# Patient Record
Sex: Female | Born: 1965 | Race: White | Hispanic: No | Marital: Married | Smoking: Former smoker
Health system: Southern US, Community
[De-identification: ages and names within clinical notes are randomized; demographics above are authoritative.]

## PROBLEM LIST (undated history)

## (undated) DIAGNOSIS — F17200 Nicotine dependence, unspecified, uncomplicated: Secondary | ICD-10-CM

## (undated) DIAGNOSIS — N809 Endometriosis, unspecified: Secondary | ICD-10-CM

## (undated) HISTORY — PX: ABDOMINAL HYSTERECTOMY: SHX81

## (undated) HISTORY — PX: TUBAL LIGATION: SHX77

## (undated) HISTORY — DX: Endometriosis, unspecified: N80.9

## (undated) HISTORY — DX: Nicotine dependence, unspecified, uncomplicated: F17.200

## (undated) HISTORY — PX: TONSILLECTOMY AND ADENOIDECTOMY: SUR1326

---

## 2001-02-17 ENCOUNTER — Emergency Department (HOSPITAL_COMMUNITY): Admission: EM | Admit: 2001-02-17 | Discharge: 2001-02-17 | Payer: Self-pay | Admitting: Emergency Medicine

## 2001-02-17 ENCOUNTER — Encounter: Payer: Self-pay | Admitting: Emergency Medicine

## 2012-04-24 ENCOUNTER — Emergency Department
Admission: EM | Admit: 2012-04-24 | Discharge: 2012-04-24 | Disposition: A | Discharge status: Home / Self Care | Payer: Self-pay | Source: Home / Self Care | Attending: Emergency Medicine | Admitting: Emergency Medicine

## 2012-04-24 DIAGNOSIS — R059 Cough, unspecified: Secondary | ICD-10-CM

## 2012-04-24 DIAGNOSIS — R05 Cough: Secondary | ICD-10-CM

## 2012-04-24 DIAGNOSIS — J069 Acute upper respiratory infection, unspecified: Secondary | ICD-10-CM

## 2012-04-24 MED ORDER — HYDROCODONE-HOMATROPINE 5-1.5 MG/5ML PO SYRP: 5.0000 mL | ORAL_SOLUTION | Freq: Four times a day (QID) | ORAL | Status: DC | PRN

## 2012-04-24 MED ORDER — AZITHROMYCIN 250 MG PO TABS: ORAL_TABLET | ORAL | Status: DC

## 2012-04-24 NOTE — ED Provider Notes (Addendum)
History     CSN: [id number]  Arrival date & time 04/24/12  1700   First MD Initiated Contact with Patient 04/24/12 1702      Chief Complaint  Patient presents with  . Nasal Congestion    x 1 week  . Cough    x 1 week    (Consider location/radiation/quality/duration/timing/severity/associated sxs/prior treatment) HPI Sabrina Hayes is a 46 y.o. female who complains of onset of cold symptoms for 8 days.  The symptoms are constant and mild-moderate in severity. + sore throat + cough No pleuritic pain No wheezing + nasal congestion + post-nasal drainage + sinus pain/pressure + chest congestion No itchy/red eyes No earache No hemoptysis No SOB + chills/sweats No fever No nausea No vomiting No abdominal pain No diarrhea No skin rashes No fatigue No myalgias + headache    History reviewed. No pertinent past medical history.  Past Surgical History  Procedure Date  . Abdominal hysterectomy     Family History  Problem Relation Age of Onset  . Hypertension Mother     History  Substance Use Topics  . Smoking status: Current Every Day Smoker -- 0.5 packs/day for 34 years    Types: Cigarettes  . Smokeless tobacco: Never Used  . Alcohol Use: No    OB History    Grav Para Term Preterm Abortions TAB SAB Ect Mult Living                  Review of Systems  All other systems reviewed and are negative.    Allergies  Codeine and Penicillins  Home Medications   Current Outpatient Rx  Name Route Sig Dispense Refill  . AZITHROMYCIN 250 MG PO TABS  Use as directed 1 each 0  . HYDROCODONE-HOMATROPINE 5-1.5 MG/5ML PO SYRP Oral Take 5 mLs by mouth every 6 (six) hours as needed for cough. 120 mL 0    BP 120/76  Pulse 81  Temp 98.3 F (36.8 C) (Oral)  Resp 18  Ht 5\' 7"  (1.702 m)  Wt 194 lb (87.998 kg)  BMI 30.38 kg/m2  SpO2 97%  Physical Exam  Nursing note and vitals reviewed. Constitutional: She is oriented to person, place, and time. She appears  well-developed and well-nourished.  HENT:  Head: Normocephalic and atraumatic.  Right Ear: Tympanic membrane, external ear and ear canal normal.  Left Ear: Tympanic membrane, external ear and ear canal normal.  Nose: Mucosal edema and rhinorrhea present.  Mouth/Throat: Posterior oropharyngeal erythema present. No oropharyngeal exudate or posterior oropharyngeal edema.  Eyes: No scleral icterus.  Neck: Neck supple.  Cardiovascular: Regular rhythm and normal heart sounds.   Pulmonary/Chest: Effort normal and breath sounds normal. No respiratory distress.  Neurological: She is alert and oriented to person, place, and time.  Skin: Skin is warm and dry.  Psychiatric: She has a normal mood and affect. Her speech is normal.    ED Course  Procedures (including critical care time)  Labs Reviewed - No data to display No results found.   1. Acute upper respiratory infections of unspecified site   2. Cough       MDM  1)  Take the prescribed antibiotic as instructed (Zpak).  She is allergic to codeine, but has taken other narcotic pain meds previously, so gave Rx for Hydromet.  If allergic reaction, stop and call clinic. 2)  Use nasal saline solution (over the counter) at least 3 times a day. 3)  Use over the counter decongestants like Zyrtec-D every 12  hours as needed to help with congestion.  If you have hypertension, do not take medicines with sudafed.  4)  Can take tylenol every 6 hours or motrin every 8 hours for pain or fever. 5)  Follow up with your primary doctor if no improvement in 5-7 days, sooner if increasing pain, fever, or new symptoms.     Marlaine Hind, MD 04/24/12 1744  Marlaine Hind, MD 04/24/12 1745

## 2012-04-24 NOTE — ED Notes (Signed)
Sabrina Hayes complains of dry cough and nasal congestion for 1 week. She states she has had fever, chills, body aches and sweats. The nasal discharge is clear.

## 2012-08-21 ENCOUNTER — Ambulatory Visit (INDEPENDENT_AMBULATORY_CARE_PROVIDER_SITE_OTHER)
Admission: RE | Admit: 2012-08-21 | Discharge: 2012-08-21 | Disposition: A | Discharge status: Home / Self Care | Payer: BC Managed Care – PPO | Source: Ambulatory Visit | Attending: Family Medicine | Admitting: Family Medicine

## 2012-08-21 ENCOUNTER — Encounter: Payer: Self-pay | Admitting: Family Medicine

## 2012-08-21 ENCOUNTER — Ambulatory Visit (INDEPENDENT_AMBULATORY_CARE_PROVIDER_SITE_OTHER): Payer: BC Managed Care – PPO | Admitting: Family Medicine

## 2012-08-21 VITALS — BP 108/70 | HR 78 | Temp 97.7°F | Ht 67.0 in | Wt 187.0 lb

## 2012-08-21 DIAGNOSIS — S63619A Unspecified sprain of unspecified finger, initial encounter: Secondary | ICD-10-CM

## 2012-08-21 DIAGNOSIS — S6390XA Sprain of unspecified part of unspecified wrist and hand, initial encounter: Secondary | ICD-10-CM

## 2012-08-21 DIAGNOSIS — F17200 Nicotine dependence, unspecified, uncomplicated: Secondary | ICD-10-CM

## 2012-08-21 MED ORDER — VARENICLINE TARTRATE 1 MG PO TABS: 1.0000 mg | ORAL_TABLET | Freq: Two times a day (BID) | ORAL | Status: DC

## 2012-08-21 MED ORDER — VARENICLINE TARTRATE 0.5 MG X 11 & 1 MG X 42 PO MISC: ORAL | Status: DC

## 2012-08-21 NOTE — Progress Notes (Signed)
Office Note 08/25/2012  CC:  Chief Complaint  Patient presents with  . Establish Care    never goes to doctor, no problems but would like to discuss chantix    HPI:  Sabrina Hayes is a 47 y.o. White female who is here to establish care and discuss chantix. Patient's most recent primary MD: none Old records were not reviewed prior to or during today's visit.  Pt here b/c she is interested in smoking cessation.  Describes success when taking chantix for 1 mo in the past--stopped taking it b/c she was doing fine.  Has relapsed and wants to try chantix again.   Currently smokes 1/2 pack/day and has done so for 35 yrs or so.  Also, has c/o left hand middle finger injury--hyperextension--about 2 mo ago.  Has hurt some and swollen on and off since that time, worse after excessive use (which is routine for her job).  No redness.  She takes no meds for this. She did not get this evaluated at the time of the injury.  A friend at work keeps bugging her to get it checked out.   Past Medical History  Diagnosis Date  . Tobacco dependence   . Endometriosis   No hx of abnormal pap smears per pt report.  Says the GYN MD who saw her and did her hysterectomy was "old and probably is retired by now".    Past Surgical History  Procedure Date  . Abdominal hysterectomy     for NONMALIGNANT reasons (endometriosis).  Ovaries are still in.    Family History  Problem Relation Age of Onset  . Hypertension Mother     History   Social History  . Marital Status: Married    Spouse Name: N/A    Number of Children: N/A  . Years of Education: N/A   Occupational History  . Not on file.   Social History Main Topics  . Smoking status: Current Every Day Smoker -- 0.5 packs/day for 34 years    Types: Cigarettes  . Smokeless tobacco: Never Used  . Alcohol Use: No  . Drug Use: No  . Sexually Active:    Other Topics Concern  . Not on file   Social History Narrative   Married, 2 children.Works in  [employer].Tobacco 18 pack-yr hx, ongoing.No signif alcohol, no drug use.No exercise.   MEDS: none  Allergies  Allergen Reactions  . Codeine   . Penicillins     ROS Review of Systems  Constitutional: Negative for fever and fatigue.  HENT: Negative for congestion and sore throat.   Eyes: Negative for visual disturbance.  Respiratory: Positive for cough (mild chronic "smoker's" cough).   Cardiovascular: Negative for chest pain.  Gastrointestinal: Negative for nausea and abdominal pain.  Genitourinary: Negative for dysuria.  Musculoskeletal: Positive for joint swelling (finger, as per HPI). Negative for back pain.  Skin: Negative for rash.  Neurological: Negative for weakness and headaches.  Hematological: Negative for adenopathy.    PE; Blood pressure 108/70, pulse 78, temperature 97.7 F (36.5 C), temperature source Temporal, height 5\' 7"  (1.702 m), weight 187 lb (84.823 kg), SpO2 97.00%. Gen: Alert, well appearing.  Patient is oriented to person, place, time, and situation. ENT: Eyes: no injection, icteris, swelling, or exudate.  EOMI, PERRLA. Nose: no drainage or turbinate edema/swelling.  No injection or focal lesion.  Mouth: lips without lesion/swelling.  Oral mucosa pink and moist.   Oropharynx without erythema, exudate, or swelling.  With full opening of jaw  she has temporary dislocation of both TM joints. Neck - No masses or thyromegaly or limitation in range of motion CV: RRR, no m/r/g.   LUNGS: CTA bilat, nonlabored resps, good aeration in all lung fields. ABD: soft, NT/ND EXT: no clubbing, cyanosis, or edema.  Left hand, middle finger with mild swelling at the MCP joint and prox phalanx.  ROM fully intact but mildly painful in middle finger.  Nontender.  No instability of collateral ligaments.     Pertinent labs:  None today  ASSESSMENT AND PLAN:   New pt: no old records to obtain.  Tobacco dependence We'll try chantix again since she got good  results last time and tolerated the med fine. Rx's given for starter pack + maintenance dosing-- for a total therapeutic duration of 12 wks.  Finger sprain Check x-ray today. Recommended rest + buddy taping but pt says "I gotta keep on going--can't let something like this stop me, there's work to do".   An After Visit Summary was printed and given to the patient.  Return in about 1 month (around 09/18/2012) for CPE (morning appt so patient can be fasting for labs).

## 2012-08-25 ENCOUNTER — Encounter: Payer: Self-pay | Admitting: Family Medicine

## 2012-08-25 DIAGNOSIS — F17200 Nicotine dependence, unspecified, uncomplicated: Secondary | ICD-10-CM | POA: Insufficient documentation

## 2012-08-25 NOTE — Assessment & Plan Note (Signed)
We'll try chantix again since she got good results last time and tolerated the med fine. Rx's given for starter pack + maintenance dosing-- for a total therapeutic duration of 12 wks.

## 2012-08-25 NOTE — Assessment & Plan Note (Signed)
Check x-ray today. Recommended rest + buddy taping but pt says "I gotta keep on going--can't let something like this stop me, there's work to do".

## 2012-09-18 ENCOUNTER — Encounter: Payer: Self-pay | Admitting: Family Medicine

## 2012-09-18 ENCOUNTER — Ambulatory Visit (INDEPENDENT_AMBULATORY_CARE_PROVIDER_SITE_OTHER): Payer: BC Managed Care – PPO | Admitting: Family Medicine

## 2012-09-18 VITALS — BP 126/78 | HR 70 | Ht 67.0 in | Wt 179.0 lb

## 2012-09-18 DIAGNOSIS — Z0000 Encounter for general adult medical examination without abnormal findings: Secondary | ICD-10-CM

## 2012-09-18 LAB — CBC WITH DIFFERENTIAL/PLATELET
Basophils Absolute: 0 10*3/uL (ref 0.0–0.1)
Basophils Relative: 0.5 % (ref 0.0–3.0)
Eosinophils Absolute: 0.3 10*3/uL (ref 0.0–0.7)
Eosinophils Relative: 3.7 % (ref 0.0–5.0)
HCT: 41.3 % (ref 36.0–46.0)
Hemoglobin: 13.8 g/dL (ref 12.0–15.0)
Lymphocytes Relative: 38.8 % (ref 12.0–46.0)
Lymphs Abs: 2.9 10*3/uL (ref 0.7–4.0)
MCHC: 33.4 g/dL (ref 30.0–36.0)
MCV: 91.2 fl (ref 78.0–100.0)
Monocytes Absolute: 0.6 10*3/uL (ref 0.1–1.0)
Monocytes Relative: 8.6 % (ref 3.0–12.0)
Neutro Abs: 3.6 10*3/uL (ref 1.4–7.7)
Neutrophils Relative %: 48.4 % (ref 43.0–77.0)
Platelets: 342 10*3/uL (ref 150.0–400.0)
RBC: 4.53 Mil/uL (ref 3.87–5.11)
RDW: 13.8 % (ref 11.5–14.6)
WBC: 7.4 10*3/uL (ref 4.5–10.5)

## 2012-09-18 LAB — COMPREHENSIVE METABOLIC PANEL
ALT: 14 U/L (ref 0–35)
Albumin: 4.1 g/dL (ref 3.5–5.2)
CO2: 27 mEq/L (ref 19–32)
Calcium: 9.1 mg/dL (ref 8.4–10.5)
Chloride: 105 mEq/L (ref 96–112)
GFR: 113.73 mL/min (ref >60.00)
Glucose, Bld: 78 mg/dL (ref 70–99)
Potassium: 3.8 mEq/L (ref 3.5–5.1)
Sodium: 138 mEq/L (ref 135–145)
Total Bilirubin: 1 mg/dL (ref 0.3–1.2)
Total Protein: 7 g/dL (ref 6.0–8.3)

## 2012-09-18 LAB — TSH: TSH: 0.43 u[IU]/mL (ref 0.35–5.50)

## 2012-09-18 LAB — LIPID PANEL
Cholesterol: 103 mg/dL (ref 0–200)
VLDL: 18.6 mg/dL (ref 0.0–40.0)

## 2012-09-18 NOTE — Assessment & Plan Note (Addendum)
Reviewed age and gender appropriate health maintenance issues (prudent diet, regular exercise, health risks of tobacco and excessive alcohol, use of seatbelts, fire alarms in home, use of sunscreen).  Also reviewed age and gender appropriate health screening as well as vaccine recommendations. Fasting health panel labs drawn today. She declined mammogram today despite being informed of benefits/risks of this screening test. No further pap smears required due to hysterectomy for benign reasons and no prior hx of abnormal paps. Encouraged smoking cessation, which she'll start in one week (will start chantix tomorrow). Encouraged increased exercise.  She is starting vegan diet and I encouraged her to take vita B12 and folate supplement if she is consistent with this diet.

## 2012-09-18 NOTE — Progress Notes (Signed)
Office Note 09/18/2012  CC:  Chief Complaint  Patient presents with  . Annual Exam    fasting labs    HPI:  Sabrina Hayes is a 47 y.o. White female who is CPE with fasting labs. Hasn't had a chance to start chantix yet--plans on tomorrow. Feels good but lost her voice a couple of days ago, has slight cough, slight ST--esp at night (dry), slight nasal cong/PND.  NO fevers. She fasted today for lab work.  She declines breast exam today. She also declines mammogram today.  Past Medical History  Diagnosis Date  . Tobacco dependence   . Endometriosis     Past Surgical History  Procedure Laterality Date  . Abdominal hysterectomy      for NONMALIGNANT reasons (endometriosis).  Ovaries are still in.    Family History  Problem Relation Age of Onset  . Hypertension Mother     History   Social History  . Marital Status: Married    Spouse Name: N/A    Number of Children: N/A  . Years of Education: N/A   Occupational History  . Not on file.   Social History Main Topics  . Smoking status: Current Every Day Smoker -- 0.50 packs/day for 34 years    Types: Cigarettes  . Smokeless tobacco: Never Used  . Alcohol Use: No  . Drug Use: No  . Sexually Active:    Other Topics Concern  . Not on file   Social History Narrative   Married, 2 children.   Works in [employer].   Tobacco 18 pack-yr hx, ongoing.   No signif alcohol, no drug use.   No exercise.    Outpatient Prescriptions Prior to Visit  Medication Sig Dispense Refill  . varenicline (CHANTIX CONTINUING MONTH PAK) 1 MG tablet Take 1 tablet (1 mg total) by mouth 2 (two) times daily.  60 tablet  1  . varenicline (CHANTIX STARTING MONTH PAK) 0.5 MG X 11 & 1 MG X 42 tablet Take one 0.5 mg tablet by mouth once daily for 3 days, then increase to one 0.5 mg tablet twice daily for 4 days, then increase to one 1 mg tablet twice daily.  53 tablet  0   No facility-administered medications prior to visit.     Allergies  Allergen Reactions  . Codeine   . Penicillins     ROS Review of Systems  Constitutional: Negative for fever, chills, appetite change and fatigue.  HENT: Positive for voice change. Negative for ear pain, congestion, sore throat, neck stiffness and dental problem.        See HPI  Eyes: Negative for discharge, redness and visual disturbance.  Respiratory: Negative for cough, chest tightness, shortness of breath and wheezing.   Cardiovascular: Negative for chest pain, palpitations and leg swelling.  Gastrointestinal: Negative for nausea, vomiting, abdominal pain, diarrhea and blood in stool.  Genitourinary: Negative for dysuria, urgency, frequency, hematuria, flank pain and difficulty urinating.  Musculoskeletal: Negative for myalgias, back pain, joint swelling and arthralgias.  Skin: Negative for pallor and rash.  Neurological: Negative for dizziness, speech difficulty, weakness and headaches.  Hematological: Negative for adenopathy. Does not bruise/bleed easily.  Psychiatric/Behavioral: Negative for confusion and sleep disturbance. The patient is not nervous/anxious.     PE; Blood pressure 126/78, pulse 70, height 5\' 7"  (1.702 m), weight 179 lb (81.194 kg). Gen: Alert, well appearing.  Patient is oriented to person, place, time, and situation. AFFECT: pleasant, lucid thought and speech. ENT: Ears: EACs clear,  normal epithelium.  TMs with good light reflex and landmarks bilaterally.  Eyes: no injection, icteris, swelling, or exudate.  EOMI, PERRLA. Nose: no drainage or turbinate edema/swelling.  No injection or focal lesion.  Mouth: lips without lesion/swelling.  Oral mucosa pink and moist.  Dentition intact and without obvious caries or gingival swelling.  Oropharynx without erythema, exudate, or swelling.  Neck: supple/nontender.  No LAD, mass, or TM.  Carotid pulses 2+ bilaterally, without bruits. CV: RRR, no m/r/g.   LUNGS: CTA bilat, nonlabored resps, good aeration  in all lung fields. ABD: soft, NT, ND, BS normal.  No hepatospenomegaly or mass.  No bruits. EXT: no clubbing, cyanosis, or edema.  Musculoskeletal: no joint swelling, erythema, warmth, or tenderness.  ROM of all joints intact. Skin - no sores or suspicious lesions or rashes or color changes Neuro: CN 2-12 intact bilaterally, strength 5/5 in proximal and distal upper extremities and lower extremities bilaterally.  No sensory deficits.  No tremor.  No disdiadochokinesis.  No ataxia.  Upper extremity and lower extremity DTRs symmetric.  No pronator drift.   Pertinent labs:  none  ASSESSMENT AND PLAN:   Health maintenance examination Reviewed age and gender appropriate health maintenance issues (prudent diet, regular exercise, health risks of tobacco and excessive alcohol, use of seatbelts, fire alarms in home, use of sunscreen).  Also reviewed age and gender appropriate health screening as well as vaccine recommendations. Fasting health panel labs drawn today. She declined mammogram today despite being informed of benefits/risks of this screening test. No further pap smears required due to hysterectomy for benign reasons and no prior hx of abnormal paps. Encouraged smoking cessation, which she'll start in one week (will start chantix tomorrow). Encouraged increased exercise.  She is starting vegan diet and I encouraged her to take vita B12 and folate supplement if she is consistent with this diet.    An After Visit Summary was printed and given to the patient.  FOLLOW UP:  Return in about 1 year (around 09/18/2013) for CPE.

## 2012-09-18 NOTE — Patient Instructions (Signed)

## 2012-10-05 ENCOUNTER — Encounter: Payer: Self-pay | Admitting: *Deleted

## 2013-02-20 ENCOUNTER — Emergency Department (INDEPENDENT_AMBULATORY_CARE_PROVIDER_SITE_OTHER): Payer: BC Managed Care – PPO

## 2013-02-20 ENCOUNTER — Emergency Department
Admission: EM | Admit: 2013-02-20 | Discharge: 2013-02-20 | Disposition: A | Discharge status: Home / Self Care | Payer: BC Managed Care – PPO | Source: Home / Self Care | Attending: Family Medicine | Admitting: Family Medicine

## 2013-02-20 DIAGNOSIS — R05 Cough: Secondary | ICD-10-CM

## 2013-02-20 DIAGNOSIS — R0602 Shortness of breath: Secondary | ICD-10-CM

## 2013-02-20 DIAGNOSIS — J209 Acute bronchitis, unspecified: Secondary | ICD-10-CM

## 2013-02-20 LAB — POCT CBC W AUTO DIFF (K'VILLE URGENT CARE)

## 2013-02-20 MED ORDER — BENZONATATE 100 MG PO CAPS: ORAL_CAPSULE | ORAL | Status: DC

## 2013-02-20 MED ORDER — DOXYCYCLINE HYCLATE 100 MG PO CAPS: 100.0000 mg | ORAL_CAPSULE | Freq: Two times a day (BID) | ORAL | Status: DC

## 2013-02-20 MED ORDER — PREDNISONE 20 MG PO TABS: 20.0000 mg | ORAL_TABLET | Freq: Two times a day (BID) | ORAL | Status: DC

## 2013-02-20 NOTE — ED Provider Notes (Signed)
CSN: [id number]     Arrival date & time 02/20/13  1158 History     First MD Initiated Contact with Patient 02/20/13 1220     Chief Complaint  Patient presents with  . Shortness of Breath    x 2 weeks  . Cough    x 2 weeks      HPI Comments: Patient complains of onset of a cold-like illness about 2 weeks ago with initial cough and nasal congestion, followed by a mild sore throat about 2 days later.  The non-productive cough has gradually become worse.  She now has shortness of breath with activity and wheezing at night.  No pleuritic pain.  For the past week she has had intermittent chills/sweats.  She has had pneumonia in the distant past.  She continues to smoke.  The history is provided by the patient.    Past Medical History  Diagnosis Date  . Tobacco dependence   . Endometriosis    Past Surgical History  Procedure Laterality Date  . Abdominal hysterectomy      for NONMALIGNANT reasons (endometriosis).  Ovaries are still in.  . Tonsillectomy and adenoidectomy    . Tubal ligation     Family History  Problem Relation Age of Onset  . Hypertension Mother    History  Substance Use Topics  . Smoking status: Current Every Day Smoker -- 0.50 packs/day for 34 years    Types: Cigarettes  . Smokeless tobacco: Never Used  . Alcohol Use: No   OB History   Grav Para Term Preterm Abortions TAB SAB Ect Mult Living                 Review of Systems + sore throat + cough No pleuritic pain + wheezing + nasal congestion + post-nasal drainage No sinus pain/pressure No itchy/red eyes No earache No hemoptysis +SOB with activity No fever, + chills/sweats No nausea No vomiting No abdominal pain No diarrhea No urinary symptoms No skin rashes + fatigue No myalgias + headache Used OTC meds without relief  Allergies  Codeine and Penicillins  Home Medications   Current Outpatient Rx  Name  Route  Sig  Dispense  Refill  . benzonatate (TESSALON) 100 MG capsule     Take one cap at bedtime as necessary for cough   12 capsule   0   . doxycycline (VIBRAMYCIN) 100 MG capsule   Oral   Take 1 capsule (100 mg total) by mouth 2 (two) times daily.   20 capsule   0   . predniSONE (DELTASONE) 20 MG tablet   Oral   Take 1 tablet (20 mg total) by mouth 2 (two) times daily. Take with food.   10 tablet   0   . varenicline (CHANTIX CONTINUING MONTH PAK) 1 MG tablet   Oral   Take 1 tablet (1 mg total) by mouth 2 (two) times daily.   60 tablet   1   . varenicline (CHANTIX STARTING MONTH PAK) 0.5 MG X 11 & 1 MG X 42 tablet      Take one 0.5 mg tablet by mouth once daily for 3 days, then increase to one 0.5 mg tablet twice daily for 4 days, then increase to one 1 mg tablet twice daily.   53 tablet   0    BP 112/67  Pulse 87  Temp(Src) 98.1 F (36.7 C) (Oral)  Ht 5\' 7"  (1.702 m)  Wt 173 lb (78.472 kg)  BMI 27.09 kg/m2  SpO2 97% Physical Exam Nursing notes and Vital Signs reviewed. Appearance:  Patient appears healthy, stated age, and in no acute distress Eyes:  Pupils are equal, round, and reactive to light and accomodation.  Extraocular movement is intact.  Conjunctivae are not inflamed  Ears:  Canals normal.  Tympanic membranes normal.  Nose:  Mildly congested turbinates.  No sinus tenderness.   Pharynx:  Normal Neck:  Supple.   No adenopathy Lungs:   Bilateral wheezes and scattered rhonchi.  No rales.  Breath sounds are equal.  Heart:  Regular rate and rhythm without murmurs, rubs, or gallops.  Abdomen:  Nontender without masses or hepatosplenomegaly.  Bowel sounds are present.  No CVA or flank tenderness.  Extremities:  No edema.  No calf tenderness Skin:  No rash present.   ED Course   Procedures  none  Labs Reviewed  POCT CBC W AUTO DIFF ([facility]) - Abnormal; Notable for the following:  WBC 11.3; LY 40.5; MO 6.3; GR 53.2; Hgb 14.2; Platelets 364    Dg Chest 2 View  02/20/2013   *RADIOLOGY REPORT*  Clinical Data: Shortness  of breath, cough.  CHEST - 2 VIEW  Comparison: None.  Findings: Mild hyperinflation.  Heart is normal size.  No effusions.  No acute bony abnormality.  IMPRESSION: Mild hyperinflation.  No acute findings.   Original Report Authenticated By: Charlett Nose, M.D.   1. Cough   2. Acute bronchitis; note mild leukocytosis 11.3   3. Bronchitis, acute, with bronchospasm     MDM  Begin doxycycline and prednisone burst. Take plain Mucinex (guaifenesin) twice daily for cough and congestion.  Increase fluid intake, rest. Stop all antihistamines for now, and other non-prescription cough/cold preparations. Urged to discontinue smoking Follow-up with family doctor if not improving 7 to 10 days.   Lattie Haw, MD 02/20/13 1316

## 2013-02-20 NOTE — ED Notes (Signed)
Breyah complains of body aches, dizziness, headaches, sore throat, sneezing, congestion, wheezing, dry cough, and shortness of breath for 2 weeks. She also has fever, chills and sweats.

## 2013-05-21 ENCOUNTER — Ambulatory Visit (INDEPENDENT_AMBULATORY_CARE_PROVIDER_SITE_OTHER): Payer: BC Managed Care – PPO | Admitting: Family Medicine

## 2013-05-21 ENCOUNTER — Encounter: Payer: Self-pay | Admitting: Family Medicine

## 2013-05-21 VITALS — BP 115/72 | HR 69 | Temp 98.2°F | Resp 18 | Ht 67.0 in | Wt 181.0 lb

## 2013-05-21 DIAGNOSIS — L299 Pruritus, unspecified: Secondary | ICD-10-CM

## 2013-05-21 MED ORDER — PREDNISONE 20 MG PO TABS: ORAL_TABLET | ORAL | Status: DC

## 2013-05-21 NOTE — Assessment & Plan Note (Signed)
Unknown etiology. Prednisone 40mg  qd x 7d, then 20mg  qd x 7d, then 10mg  qd x 8d. Keep an eye out for irritants, triggers. If this goes away and then returns again I would have her see an allergist or dermatologist.

## 2013-05-21 NOTE — Progress Notes (Signed)
OFFICE NOTE  05/21/2013  CC:  Chief Complaint  Patient presents with  . itch    both arms for months     HPI: Patient is a 47 y.o. Caucasian female who is here for itch on arms. At least a 4 mo hx of bilat upper arms itching, w/out rash.  Extensor surfaces only. Wynelle Link exposure makes it worse.  She has applied many "home remedies"--tea bags, urine, chlorox, alcohol, peroxide, many OTC creams and lotions including benadryl cream and hydrocortisone ointment.  No other part of the body itches. No known contact irritants. She is a vegan, gets B12 supp through "monster" energy drinks.    Pertinent PMH:  Past Medical History  Diagnosis Date  . Tobacco dependence   . Endometriosis     MEDS:  NONE  PE: Blood pressure 115/72, pulse 69, temperature 98.2 F (36.8 C), temperature source Temporal, resp. rate 18, height 5\' 7"  (1.702 m), weight 181 lb (82.101 kg), SpO2 98.00%. Gen: Alert, well appearing.  Patient is oriented to person, place, time, and situation. CV: RRR, no m/r/g.   LUNGS: CTA bilat, nonlabored resps, good aeration in all lung fields. SKIN: extensor surfaces of both upper arms have a generalized, mild erythema without rash.  This is not distinctly demarcated but there is a slight delineation between the itchy skin and the non-itchy skin.    IMPRESSION AND PLAN:  Skin pruritus Unknown etiology. Prednisone 40mg  qd x 7d, then 20mg  qd x 7d, then 10mg  qd x 8d. Keep an eye out for irritants, triggers. If this goes away and then returns again I would have her see an allergist or dermatologist.   An After Visit Summary was printed and given to the patient.  FOLLOW UP: prn

## 2013-10-30 ENCOUNTER — Ambulatory Visit (INDEPENDENT_AMBULATORY_CARE_PROVIDER_SITE_OTHER): Payer: 59 | Admitting: Internal Medicine

## 2013-10-30 ENCOUNTER — Encounter: Payer: Self-pay | Admitting: Internal Medicine

## 2013-10-30 VITALS — BP 126/80 | HR 70 | Temp 97.0°F | Resp 20 | Ht 67.0 in | Wt 195.0 lb

## 2013-10-30 DIAGNOSIS — J209 Acute bronchitis, unspecified: Secondary | ICD-10-CM

## 2013-10-30 MED ORDER — DOXYCYCLINE MONOHYDRATE 100 MG PO TABS: 100.0000 mg | ORAL_TABLET | Freq: Two times a day (BID) | ORAL | Status: DC

## 2013-10-30 MED ORDER — ALBUTEROL SULFATE HFA 108 (90 BASE) MCG/ACT IN AERS: 2.0000 | INHALATION_SPRAY | Freq: Four times a day (QID) | RESPIRATORY_TRACT | Status: DC | PRN

## 2013-10-30 NOTE — Progress Notes (Signed)
Pre-visit discussion using our clinic review tool. No additional management support is needed unless otherwise documented below in the visit note.  

## 2013-10-30 NOTE — Progress Notes (Signed)
   Subjective:    Patient ID: Sabrina KochAmanda Hayes, female    DOB: 01/08/1966, 48 y.o.   MRN: [id number]  HPI Thinks she has bronchitis again Sick since about 9 days ago Dry cough Shortness of breath Body aches and feels tired  Some headache No sig nasal congestion or ear pain No sore throat Felt hot--not sure if fever No sweats  Used home cough remedy---helped her sleep  Not ready to stop smoking  No current outpatient prescriptions on file prior to visit.   No current facility-administered medications on file prior to visit.    Allergies  Allergen Reactions  . Codeine   . Penicillins     Past Medical History  Diagnosis Date  . Tobacco dependence   . Endometriosis     Past Surgical History  Procedure Laterality Date  . Abdominal hysterectomy      for NONMALIGNANT reasons (endometriosis).  Ovaries are still in.  . Tonsillectomy and adenoidectomy    . Tubal ligation      Family History  Problem Relation Age of Onset  . Hypertension Mother     History   Social History  . Marital Status: Married    Spouse Name: N/A    Number of Children: N/A  . Years of Education: N/A   Occupational History  . Not on file.   Social History Main Topics  . Smoking status: Current Every Day Smoker -- 0.50 packs/day for 34 years    Types: Cigarettes  . Smokeless tobacco: Never Used  . Alcohol Use: No  . Drug Use: No  . Sexual Activity: Not on file   Other Topics Concern  . Not on file   Social History Narrative   Married, 2 children.   Works in [employer].   Tobacco 18 pack-yr hx, ongoing.   No signif alcohol, no drug use.   No exercise.   Review of Systems No vomiting or diarrhea Mild AM nausea Appetite is okay    Objective:   Physical Exam  Constitutional: She appears well-developed and well-nourished. No distress.  Coarse cough  HENT:  Mouth/Throat: Oropharynx is clear and moist. No oropharyngeal exudate.  No sinus tenderness TMs  normal Moderate nasal congestion, some yellow discharge  Neck: Normal range of motion. Neck supple. No thyromegaly present.  Pulmonary/Chest: Effort normal. No respiratory distress. She has no wheezes. She has no rales.  Not tight Fair air movement Slight rhonchi  Lymphadenopathy:    She has no cervical adenopathy.          Assessment & Plan:

## 2013-10-30 NOTE — Assessment & Plan Note (Signed)
Almost 10 days Will treat with doxy Didn't like prednisone and not tight---will give albuterol for prn use Discussed the importance of cigarette cessation

## 2014-02-18 ENCOUNTER — Encounter: Payer: Self-pay | Admitting: Family Medicine

## 2014-02-18 ENCOUNTER — Ambulatory Visit (INDEPENDENT_AMBULATORY_CARE_PROVIDER_SITE_OTHER): Payer: 59 | Admitting: Family Medicine

## 2014-02-18 VITALS — BP 131/82 | HR 65 | Temp 98.2°F | Ht 67.0 in | Wt 200.0 lb

## 2014-02-18 DIAGNOSIS — L299 Pruritus, unspecified: Secondary | ICD-10-CM

## 2014-02-18 MED ORDER — FEXOFENADINE HCL 180 MG PO TABS: 180.0000 mg | ORAL_TABLET | Freq: Every day | ORAL | Status: DC

## 2014-02-18 NOTE — Progress Notes (Signed)
Pre visit review using our clinic review tool, if applicable. No additional management support is needed unless otherwise documented below in the visit note. 

## 2014-02-18 NOTE — Progress Notes (Signed)
OFFICE NOTE  02/18/2014  CC:  Chief Complaint  Patient presents with  . Rash   HPI: Patient is a 48 y.o. Caucasian female who is here for chronic itching in upper arms. Systemic steroids no help in the past. Saw derm, rx'd topical steroid for possible eczema but no help. She doesn't take antihistamine.  She applies eucerin cream daily. She has not been applying the topical steroid rx'd by derm.  Pertinent PMH:  Past medical, surgical, social, and family history reviewed and no changes are noted since last office visit.  MEDS:  none  PE: Blood pressure 131/82, pulse 65, temperature 98.2 F (36.8 C), temperature source Temporal, height 5\' 7"  (1.702 m), weight 200 lb (90.719 kg), SpO2 98.00%. Gen: Alert, well appearing.  Patient is oriented to person, place, time, and situation. Skin: upper arms with no rash, discoloration, lesion, or excoriations.  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 I recommended she not apply any topical steroids. Continue daily eucerin cream, add aquafor ointment daily. Start allegra 180mg  qd.  An After Visit Summary was printed and given to the patient.  FOLLOW UP: prn

## 2014-02-18 NOTE — Patient Instructions (Signed)
Continue eucerin cream.   Add daily aquafor ointment ON TOP of the cream.

## 2014-10-14 ENCOUNTER — Encounter: Payer: Self-pay | Admitting: *Deleted

## 2014-10-14 ENCOUNTER — Emergency Department (INDEPENDENT_AMBULATORY_CARE_PROVIDER_SITE_OTHER): Payer: BLUE CROSS/BLUE SHIELD

## 2014-10-14 ENCOUNTER — Emergency Department
Admission: EM | Admit: 2014-10-14 | Discharge: 2014-10-14 | Disposition: A | Discharge status: Home / Self Care | Payer: BLUE CROSS/BLUE SHIELD | Source: Home / Self Care | Attending: Emergency Medicine | Admitting: Emergency Medicine

## 2014-10-14 DIAGNOSIS — S46911A Strain of unspecified muscle, fascia and tendon at shoulder and upper arm level, right arm, initial encounter: Secondary | ICD-10-CM

## 2014-10-14 DIAGNOSIS — M25511 Pain in right shoulder: Secondary | ICD-10-CM | POA: Diagnosis not present

## 2014-10-14 NOTE — ED Notes (Signed)
Sabrina Hayes c/o 2 days of right shoulder pain with limited ROM. She was stuffing a pillow @ work when the pain started. Denies filing workers comp.

## 2014-10-14 NOTE — ED Provider Notes (Signed)
CSN: [id number]     Arrival date & time 10/14/14  1405 History   None    Chief Complaint  Patient presents with  . Shoulder Pain   Here with husband HPI Sabrina Folksmanda c/o 2 days of right shoulder pain with limited ROM.  She was stuffing a pillow @ work when the pain started. Denies filing workers comp.  The right shoulder pain is mostly anterior , dull right shoulder, pain 3 out of 10 intensity without radiation. Pains worsens with movement. No paresthesias or focal weakness. No rash or fever or chills or nausea or vomiting or abdominal pain No chest pain or shortness of breath or cardiorespiratory symptoms. She has not tried any OTC medication for the pain. Past Medical History  Diagnosis Date  . Tobacco dependence   . Endometriosis    Past Surgical History  Procedure Laterality Date  . Abdominal hysterectomy      for NONMALIGNANT reasons (endometriosis).  Ovaries are still in.  . Tonsillectomy and adenoidectomy    . Tubal ligation     Family History  Problem Relation Age of Onset  . Hypertension Mother    History  Substance Use Topics  . Smoking status: Current Every Day Smoker -- 0.50 packs/day for 34 years    Types: Cigarettes  . Smokeless tobacco: Never Used  . Alcohol Use: No   OB History    No data available     Review of Systems  All other systems reviewed and are negative.   Allergies  Codeine and Penicillins  Home Medications   Prior to Admission medications   Medication Sig Start Date End Date Taking? Authorizing Provider  fexofenadine (ALLEGRA) 180 MG tablet Take 1 tablet (180 mg total) by mouth daily. 02/18/14   Jeoffrey MassedPhilip H McGowen, MD   BP 106/72 mmHg  Pulse 81  Resp 16  Wt 210 lb (95.255 kg)  SpO2 98% Physical Exam  Constitutional: She is oriented to person, place, and time. She appears well-developed and well-nourished. No distress.  HENT:  Head: Normocephalic and atraumatic.  Eyes: Conjunctivae and EOM are normal. Pupils are equal, round, and  reactive to light. No scleral icterus.  Neck: Normal range of motion.  Cardiovascular: Normal rate.   Pulmonary/Chest: Effort normal.  Abdominal: She exhibits no distension.  Musculoskeletal:  Mild tenderness to palpation right anterior shoulder. Some bony tenderness but no deformity. No instability. Range of motion decreased. Pain exacerbated by attempts at motion. Empty can sign is negative, but this test is limited because of pain.  Neurological: She is alert and oriented to person, place, and time.  Motor and sensory and DTRs intact and equal bilaterally  Skin: Skin is warm.  Psychiatric: She has a normal mood and affect.  Nursing note and vitals reviewed.   ED Course  Procedures (including critical care time) Labs Review Labs Reviewed - No data to display  Imaging Review Dg Shoulder Right  10/14/2014   CLINICAL DATA:  Status right shoulder over exertion 2 days ago with persistent pain  EXAM: RIGHT SHOULDER - 2+ VIEW  COMPARISON:  None.  FINDINGS: The bones of the right shoulder are adequately mineralized. There is no acute fracture nor dislocation. The glenohumeral and AC joints are unremarkable. The observed portions of the right clavicle and upper right ribs are normal. The soft tissues are unremarkable.  IMPRESSION: There is no acute or chronic bony abnormality of the right shoulder.   Electronically Signed   By: David  [place]   On: 10/14/2014  14:40     MDM   1. Strain of right shoulder, initial encounter    x-ray right shoulder negative Treatment options discussed, as well as risks, benefits, alternatives. Patient  and husband voiced understanding and agreement with the following plans:  Sling She declined prescription pain meds Advise OTC ibuprofen up to 600 mg 3 times a day with food Ice Follow-up Dr. Benjamin Stain within 1 week Precautions discussed. Red flags discussed. Questions invited and answered. Patient and husband voiced understanding and  agreement.    Lajean Manes, MD 10/14/14 2123

## 2015-08-03 ENCOUNTER — Ambulatory Visit (HOSPITAL_BASED_OUTPATIENT_CLINIC_OR_DEPARTMENT_OTHER)
Admission: RE | Admit: 2015-08-03 | Discharge: 2015-08-03 | Disposition: A | Discharge status: Home / Self Care | Payer: 59 | Source: Ambulatory Visit | Attending: Family Medicine | Admitting: Family Medicine

## 2015-08-03 ENCOUNTER — Encounter: Payer: Self-pay | Admitting: Family Medicine

## 2015-08-03 ENCOUNTER — Encounter: Payer: Self-pay | Admitting: *Deleted

## 2015-08-03 ENCOUNTER — Ambulatory Visit (INDEPENDENT_AMBULATORY_CARE_PROVIDER_SITE_OTHER): Payer: 59 | Admitting: Family Medicine

## 2015-08-03 VITALS — BP 128/82 | HR 105 | Temp 97.9°F | Resp 16 | Ht 67.0 in | Wt 210.0 lb

## 2015-08-03 DIAGNOSIS — R062 Wheezing: Secondary | ICD-10-CM

## 2015-08-03 DIAGNOSIS — R05 Cough: Secondary | ICD-10-CM

## 2015-08-03 DIAGNOSIS — R509 Fever, unspecified: Secondary | ICD-10-CM | POA: Insufficient documentation

## 2015-08-03 DIAGNOSIS — J209 Acute bronchitis, unspecified: Secondary | ICD-10-CM | POA: Diagnosis not present

## 2015-08-03 DIAGNOSIS — R059 Cough, unspecified: Secondary | ICD-10-CM | POA: Insufficient documentation

## 2015-08-03 DIAGNOSIS — R0989 Other specified symptoms and signs involving the circulatory and respiratory systems: Secondary | ICD-10-CM | POA: Insufficient documentation

## 2015-08-03 MED ORDER — PREDNISONE 50 MG PO TABS: 50.0000 mg | ORAL_TABLET | Freq: Every day | ORAL | Status: DC

## 2015-08-03 MED ORDER — DOXYCYCLINE HYCLATE 100 MG PO TABS: 100.0000 mg | ORAL_TABLET | Freq: Two times a day (BID) | ORAL | Status: DC

## 2015-08-03 MED ORDER — IPRATROPIUM BROMIDE 0.02 % IN SOLN
0.5000 mg | Freq: Once | RESPIRATORY_TRACT | Status: AC
  Administered 2015-08-03: 0.5 mg via RESPIRATORY_TRACT

## 2015-08-03 MED ORDER — ALBUTEROL SULFATE (2.5 MG/3ML) 0.083% IN NEBU
2.5000 mg | INHALATION_SOLUTION | Freq: Once | RESPIRATORY_TRACT | Status: AC
  Administered 2015-08-03: 2.5 mg via RESPIRATORY_TRACT

## 2015-08-03 MED ORDER — HYDROCODONE-HOMATROPINE 5-1.5 MG/5ML PO SYRP: 5.0000 mL | ORAL_SOLUTION | Freq: Every evening | ORAL | Status: DC | PRN

## 2015-08-03 MED ORDER — BENZONATATE 200 MG PO CAPS: 200.0000 mg | ORAL_CAPSULE | Freq: Three times a day (TID) | ORAL | Status: DC | PRN

## 2015-08-03 NOTE — Progress Notes (Signed)
Pre visit review using our clinic review tool, if applicable. No additional management support is needed unless otherwise documented below in the visit note. 

## 2015-08-03 NOTE — Patient Instructions (Signed)
Community-Acquired Pneumonia, Adult Pneumonia is an infection of the lungs. There are different types of pneumonia. One type can develop while a person is in a hospital. A different type, called community-acquired pneumonia, develops in people who are not, or have not recently been, in the hospital or other health care facility.  CAUSES Pneumonia may be caused by bacteria, viruses, or funguses. Community-acquired pneumonia is often caused by Streptococcus pneumonia bacteria. These bacteria are often passed from one person to another by breathing in droplets from the cough or sneeze of an infected person. RISK FACTORS The condition is more likely to develop in:  People who havechronic diseases, such as chronic obstructive pulmonary disease (COPD), asthma, congestive heart failure, cystic fibrosis, diabetes, or kidney disease.  People who haveearly-stage or late-stage HIV.  People who havesickle cell disease.  People who havehad their spleen removed (splenectomy).  People who havepoor Human resources officer.  People who havemedical conditions that increase the risk of breathing in (aspirating) secretions their own mouth and nose.   People who havea weakened immune system (immunocompromised).  People who smoke.  People whotravel to areas where pneumonia-causing germs commonly exist.  People whoare around animal habitats or animals that have pneumonia-causing germs, including birds, bats, rabbits, cats, and farm animals. SYMPTOMS Symptoms of this condition include:  Adry cough.  A wet (productive) cough.  Fever.  Sweating.  Chest pain, especially when breathing deeply or coughing.  Rapid breathing or difficulty breathing.  Shortness of breath.  Shaking chills.  Fatigue.  Muscle aches. DIAGNOSIS Your health care provider will take a medical history and perform a physical exam. You may also have other tests, including:  Imaging studies of your chest, including  X-rays.  Tests to check your blood oxygen level and other blood gases.  Other tests on blood, mucus (sputum), fluid around your lungs (pleural fluid), and urine. If your pneumonia is severe, other tests may be done to identify the specific cause of your illness. TREATMENT The type of treatment that you receive depends on many factors, such as the cause of your pneumonia, the medicines you take, and other medical conditions that you have. For most adults, treatment and recovery from pneumonia may occur at home. In some cases, treatment must happen in a hospital. Treatment may include:  Antibiotic medicines, if the pneumonia was caused by bacteria.  Antiviral medicines, if the pneumonia was caused by a virus.  Medicines that are given by mouth or through an IV tube.  Oxygen.  Respiratory therapy. Although rare, treating severe pneumonia may include:  Mechanical ventilation. This is done if you are not breathing well on your own and you cannot maintain a safe blood oxygen level.  Thoracentesis. This procedureremoves fluid around one lung or both lungs to help you breathe better. HOME CARE INSTRUCTIONS  Take over-the-counter and prescription medicines only as told by your health care provider.  Only takecough medicine if you are losing sleep. Understand that cough medicine can prevent your body's natural ability to remove mucus from your lungs.  If you were prescribed an antibiotic medicine, take it as told by your health care provider. Do not stop taking the antibiotic even if you start to feel better.  Sleep in a semi-upright position at night. Try sleeping in a reclining chair, or place a few pillows under your head.  Do not use tobacco products, including cigarettes, chewing tobacco, and e-cigarettes. If you need help quitting, ask your health care provider.  Drink enough water to keep your urine  clear or pale yellow. This will help to thin out mucus secretions in your  lungs. PREVENTION There are ways that you can decrease your risk of developing community-acquired pneumonia. Consider getting a pneumococcal vaccine if:  You are older than 51 years of age.  You are older than 51 years of age and are undergoing cancer treatment, have chronic lung disease, or have other medical conditions that affect your immune system. Ask your health care provider if this applies to you. There are different types and schedules of pneumococcal vaccines. Ask your health care provider which vaccination option is best for you. You may also prevent community-acquired pneumonia if you take these actions:  Get an influenza vaccine every year. Ask your health care provider which type of influenza vaccine is best for you.  Go to the dentist on a regular basis.  Wash your hands often. Use hand sanitizer if soap and water are not available. SEEK MEDICAL CARE IF:  You have a fever.  You are losing sleep because you cannot control your cough with cough medicine. SEEK IMMEDIATE MEDICAL CARE IF:  You have worsening shortness of breath.  You have increased chest pain.  Your sickness becomes worse, especially if you are an older adult or have a weakened immune system.  You cough up blood.   This information is not intended to replace advice given to you by your health care provider. Make sure you discuss any questions you have with your health care provider.   Document Released: 07/08/2005 Document Revised: 03/29/2015 Document Reviewed: 11/02/2014 Elsevier Interactive Patient Education 2016 [employer].  Smoking Cessation, Tips for Success If you are ready to quit smoking, congratulations! You have chosen to help yourself be healthier. Cigarettes bring nicotine, tar, carbon monoxide, and other irritants into your body. Your lungs, heart, and blood vessels will be able to work better without these poisons. There are many different ways to quit smoking. Nicotine gum, nicotine  patches, a nicotine inhaler, or nicotine nasal spray can help with physical craving. Hypnosis, support groups, and medicines help break the habit of smoking. WHAT THINGS CAN I DO TO MAKE QUITTING EASIER?  Here are some tips to help you quit for good:  Pick a date when you will quit smoking completely. Tell all of your friends and family about your plan to quit on that date.  Do not try to slowly cut down on the number of cigarettes you are smoking. Pick a quit date and quit smoking completely starting on that day.  Throw away all cigarettes.   Clean and remove all ashtrays from your home, work, and car.  On a card, write down your reasons for quitting. Carry the card with you and read it when you get the urge to smoke.  Cleanse your body of nicotine. Drink enough water and fluids to keep your urine clear or pale yellow. Do this after quitting to flush the nicotine from your body.  Learn to predict your moods. Do not let a bad situation be your excuse to have a cigarette. Some situations in your life might tempt you into wanting a cigarette.  Never have "just one" cigarette. It leads to wanting another and another. Remind yourself of your decision to quit.  Change habits associated with smoking. If you smoked while driving or when feeling stressed, try other activities to replace smoking. Stand up when drinking your coffee. Brush your teeth after eating. Sit in a different chair when you read the paper. Avoid alcohol while  trying to quit, and try to drink fewer caffeinated beverages. Alcohol and caffeine may urge you to smoke.  Avoid foods and drinks that can trigger a desire to smoke, such as sugary or spicy foods and alcohol.  Ask people who smoke not to smoke around you.  Have something planned to do right after eating or having a cup of coffee. For example, plan to take a walk or exercise.  Try a relaxation exercise to calm you down and decrease your stress. Remember, you may be tense  and nervous for the first 2 weeks after you quit, but this will pass.  Find new activities to keep your hands busy. Play with a pen, coin, or rubber band. Doodle or draw things on paper.  Brush your teeth right after eating. This will help cut down on the craving for the taste of tobacco after meals. You can also try mouthwash.   Use oral substitutes in place of cigarettes. Try using lemon drops, carrots, cinnamon sticks, or chewing gum. Keep them handy so they are available when you have the urge to smoke.  When you have the urge to smoke, try deep breathing.  Designate your home as a nonsmoking area.  If you are a heavy smoker, ask your health care provider about a prescription for nicotine chewing gum. It can ease your withdrawal from nicotine.  Reward yourself. Set aside the cigarette money you save and buy yourself something nice.  Look for support from others. Join a support group or smoking cessation program. Ask someone at home or at work to help you with your plan to quit smoking.  Always ask yourself, "Do I need this cigarette or is this just a reflex?" Tell yourself, "Today, I choose not to smoke," or "I do not want to smoke." You are reminding yourself of your decision to quit.  Do not replace cigarette smoking with electronic cigarettes (commonly called e-cigarettes). The safety of e-cigarettes is unknown, and some may contain harmful chemicals.  If you relapse, do not give up! Plan ahead and think about what you will do the next time you get the urge to smoke. HOW WILL I FEEL WHEN I QUIT SMOKING? You may have symptoms of withdrawal because your body is used to nicotine (the addictive substance in cigarettes). You may crave cigarettes, be irritable, feel very hungry, cough often, get headaches, or have difficulty concentrating. The withdrawal symptoms are only temporary. They are strongest when you first quit but will go away within 10-14 days. When withdrawal symptoms occur,  stay in control. Think about your reasons for quitting. Remind yourself that these are signs that your body is healing and getting used to being without cigarettes. Remember that withdrawal symptoms are easier to treat than the major diseases that smoking can cause.  Even after the withdrawal is over, expect periodic urges to smoke. However, these cravings are generally short lived and will go away whether you smoke or not. Do not smoke! WHAT RESOURCES ARE AVAILABLE TO HELP ME QUIT SMOKING? Your health care provider can direct you to community resources or hospitals for support, which may include:  Group support.  Education.  Hypnosis.  Therapy.   This information is not intended to replace advice given to you by your health care provider. Make sure you discuss any questions you have with your health care provider.   Document Released: 04/05/2004 Document Revised: 07/29/2014 Document Reviewed: 12/24/2012 Elsevier Interactive Patient Education 2016 [employer].  Rest, hydrate, mucinex or robitussin, hycodan  cough syrup at night only (no refills will be provided without appt) and doxycycline  Go get chest xray today, we will call you tomorrow. If pneumonia will need to follow up with you.  If worsening symptoms over weekend be seen by ED or Urgent care.

## 2015-08-03 NOTE — Progress Notes (Signed)
Patient ID: Sabrina Kochmanda Mckinzie, female   DOB: 03/09/1966, 50 y.o.   MRN: [id number]   Subjective:    Patient ID: Sabrina KochAmanda Alamillo   DOB: 03/01/1966, 50 y.o.    MRN: [id number]  HPI  Cough with fever: Patient presents with a five-day history of postnasal drip, rhinorrhea, nasal congestion, dry barking cough, headache, subjective fever with chills, wheezing, fatigue and shortness of breath. He states she is tolerating food and drink, but has a decreased appetite. She denies nausea, vomit, diarrhea, rash, myalgias or sore throat. He does not have a history of asthma or COPD, but she isn't every day smoker. She has tried using NyQuil and Mucinex. She is having difficulty sleeping secondary to cough.   Past Medical History  Diagnosis Date  . Tobacco dependence   . Endometriosis    Allergies  Allergen Reactions  . Codeine   . Penicillins    Past Surgical History  Procedure Laterality Date  . Abdominal hysterectomy      for NONMALIGNANT reasons (endometriosis).  Ovaries are still in.  . Tonsillectomy and adenoidectomy    . Tubal ligation     Social History  Substance Use Topics  . Smoking status: Current Every Day Smoker -- 0.50 packs/day for 34 years    Types: Cigarettes  . Smokeless tobacco: Never Used  . Alcohol Use: No    Review of Systems Negative, with the exception of above mentioned in HPI     Objective:   Physical Exam BP 128/82 mmHg  Pulse 105  Temp(Src) 97.9 F (36.6 C) (Oral)  Resp 16  Ht 5\' 7"  (1.702 m)  Wt 210 lb (95.255 kg)  BMI 32.88 kg/m2  SpO2 95% Body mass index is 32.88 kg/(m^2). Gen: Afebrile.barking cough, fatigued, appears winded. Well developed, well nourished, obese female.  HENT: AT. [place]. Bilateral TM visualized, full, fluid filled, no erythema or bulging.  MMM, no oral lesions. Bilateral nares mild erythema, no swelling Throat no erythema, noexudates. Cough present, barking, Hoarseness present, Not TTP facial sinus.  Eyes:Pupils Equal Round Reactive to  light, Extraocular movements intact,  Conjunctiva without redness, discharge or icterus. Neck/lymp/endocrine: Supple, no lymphadenopathy CV: tachycardic, reg rhythm  Chest: cough on inspiration, wheezing and crackles bilateral lung fields diffusely.  Abd: Soft. NTND. BS present Skin: No rashes, purpura or petechiae.    Assessment & Plan:  Sabrina Hayes is a 50 y.o. present for acute OV with bronchitis and concerns for pneumonia with presentation/tachycardia 1. Cough - During the day she can use Robitussin/Mucinex, or prescribe Tessalon Perles. Evening she is prescribed Hycodan cough syrup. - DG Chest 2 View; Future - doxycycline (VIBRA-TABS) 100 MG tablet; Take 1 tablet (100 mg total) by mouth 2 (two) times daily.  Dispense: 20 tablet; Refill: 0 - predniSONE (DELTASONE) 50 MG tablet; Take 1 tablet (50 mg total) by mouth daily with breakfast.  Dispense: 5 tablet; Refill: 0 - benzonatate (TESSALON) 200 MG capsule; Take 1 capsule (200 mg total) by mouth 3 (three) times daily as needed for cough.  Dispense: 20 capsule; Refill: 0  2. Inspiratory wheeze on examination - Only very minimal improvement with breathing treatment. - albuterol (PROVENTIL) (2.5 MG/3ML) 0.083% nebulizer solution 2.5 mg; Take 3 mLs (2.5 mg total) by nebulization once. - ipratropium (ATROVENT) nebulizer solution 0.5 mg; Take 2.5 mLs (0.5 mg total) by nebulization once.  3. Acute bronchitis, unspecified organism - Work excuse provided for today and tomorrow. If chest x-ray is positive for pneumonia will consider and extension. - Patient strongly  encouraged to stop smoking, smoking cessation material was provided to her today. - Doxycycline 100 mg twice a day for 10 days, prednisone 50 mg daily 5 days, Tessalon Perles prescribed. - Patient strongly encouraged to rest, maintain hydration, can use Robitussin/Mucinex, Advil/Tylenol for symptomatically relief. - Hycodan cough syrup for nighttime use only, patient counseled that  we'll be no refills. Ask [place] with patient today her allergies, she states she's had this cough syrup in the past and has not had any allergies to it.  Follow-up depending upon chest x-ray results or if patient is not improving within 1 week she should be seen sooner.

## 2015-08-04 ENCOUNTER — Telehealth: Payer: Self-pay | Admitting: Family Medicine

## 2015-08-04 NOTE — Telephone Encounter (Signed)
Please call patient: Her chest x-ray did not show any signs of pneumonia. Continue treatment as discussed at her appointment.

## 2015-08-04 NOTE — Telephone Encounter (Signed)
[facility] message with xray results and instructions [facility]

## 2015-08-08 ENCOUNTER — Telehealth: Payer: Self-pay | Admitting: *Deleted

## 2015-08-08 NOTE — Telephone Encounter (Signed)
Spoke with Dr Claiborne Billings patient already has RX for tessalon pearles and Hycodan syrup. If patient unable to tolerate Hycodan she can use Robitussin or Delsym OTC. Left message on patient voice mail with instructions.

## 2015-08-08 NOTE — Telephone Encounter (Signed)
Patient called left message stating cough syrup keeps her up at night and she is still coughing she wants something different sent in .

## 2015-09-22 ENCOUNTER — Ambulatory Visit (INDEPENDENT_AMBULATORY_CARE_PROVIDER_SITE_OTHER): Payer: 59 | Admitting: Family Medicine

## 2015-09-22 ENCOUNTER — Encounter: Payer: Self-pay | Admitting: Family Medicine

## 2015-09-22 VITALS — BP 113/71 | HR 80 | Temp 98.1°F | Resp 20 | Wt 211.2 lb

## 2015-09-22 DIAGNOSIS — J320 Chronic maxillary sinusitis: Secondary | ICD-10-CM | POA: Insufficient documentation

## 2015-09-22 DIAGNOSIS — J0100 Acute maxillary sinusitis, unspecified: Secondary | ICD-10-CM | POA: Diagnosis not present

## 2015-09-22 MED ORDER — DOXYCYCLINE HYCLATE 100 MG PO TABS: 100.0000 mg | ORAL_TABLET | Freq: Two times a day (BID) | ORAL | Status: DC

## 2015-09-22 NOTE — Patient Instructions (Signed)
Mucinex, cough syrup, advil/tylenol, rest, hydrate Doxycycline  Stop smoking.  I am placing a pulmonology referral to work up possible COPD.   Chronic Obstructive Pulmonary Disease Chronic obstructive pulmonary disease (COPD) is a common lung condition in which airflow from the lungs is limited. COPD is a general term that can be used to describe many different lung problems that limit airflow, including both chronic bronchitis and emphysema. If you have COPD, your lung function will probably never return to normal, but there are measures you can take to improve lung function and make yourself feel better. CAUSES   Smoking (common).  Exposure to secondhand smoke.  Genetic problems.  Chronic inflammatory lung diseases or recurrent infections. SYMPTOMS  Shortness of breath, especially with physical activity.  Deep, persistent (chronic) cough with a large amount of thick mucus.  Wheezing.  Rapid breaths (tachypnea).  Gray or bluish discoloration (cyanosis) of the skin, especially in your fingers, toes, or lips.  Fatigue.  Weight loss.  Frequent infections or episodes when breathing symptoms become much worse (exacerbations).  Chest tightness. DIAGNOSIS Your health care provider will take a medical history and perform a physical examination to diagnose COPD. Additional tests for COPD may include:  Lung (pulmonary) function tests.  Chest X-ray.  CT scan.  Blood tests. TREATMENT  Treatment for COPD may include:  Inhaler and nebulizer medicines. These help manage the symptoms of COPD and make your breathing more comfortable.  Supplemental oxygen. Supplemental oxygen is only helpful if you have a low oxygen level in your blood.  Exercise and physical activity. These are beneficial for nearly all people with COPD.  Lung surgery or transplant.  Nutrition therapy to gain weight, if you are underweight.  Pulmonary rehabilitation. This may involve working with a team of  health care providers and specialists, such as respiratory, occupational, and physical therapists. HOME CARE INSTRUCTIONS  Take all medicines (inhaled or pills) as directed by your health care provider.  Avoid over-the-counter medicines or cough syrups that dry up your airway (such as antihistamines) and slow down the elimination of secretions unless instructed otherwise by your health care provider.  If you are a smoker, the most important thing that you can do is stop smoking. Continuing to smoke will cause further lung damage and breathing trouble. Ask your health care provider for help with quitting smoking. He or she can direct you to community resources or hospitals that provide support.  Avoid exposure to irritants such as smoke, chemicals, and fumes that aggravate your breathing.  Use oxygen therapy and pulmonary rehabilitation if directed by your health care provider. If you require home oxygen therapy, ask your health care provider whether you should purchase a pulse oximeter to measure your oxygen level at home.  Avoid contact with individuals who have a contagious illness.  Avoid extreme temperature and humidity changes.  Eat healthy foods. Eating smaller, more frequent meals and resting before meals may help you maintain your strength.  Stay active, but balance activity with periods of rest. Exercise and physical activity will help you maintain your ability to do things you want to do.  Preventing infection and hospitalization is very important when you have COPD. Make sure to receive all the vaccines your health care provider recommends, especially the pneumococcal and influenza vaccines. Ask your health care provider whether you need a pneumonia vaccine.  Learn and use relaxation techniques to manage stress.  Learn and use controlled breathing techniques as directed by your health care provider. Controlled breathing techniques  include:  Pursed lip breathing. Start by  breathing in (inhaling) through your nose for 1 second. Then, purse your lips as if you were going to whistle and breathe out (exhale) through the pursed lips for 2 seconds.  Diaphragmatic breathing. Start by putting one hand on your abdomen just above your waist. Inhale slowly through your nose. The hand on your abdomen should move out. Then purse your lips and exhale slowly. You should be able to feel the hand on your abdomen moving in as you exhale.  Learn and use controlled coughing to clear mucus from your lungs. Controlled coughing is a series of short, progressive coughs. The steps of controlled coughing are: 1. Lean your head slightly forward. 2. Breathe in deeply using diaphragmatic breathing. 3. Try to hold your breath for 3 seconds. 4. Keep your mouth slightly open while coughing twice. 5. Spit any mucus out into a tissue. 6. Rest and repeat the steps once or twice as needed. SEEK MEDICAL CARE IF:  You are coughing up more mucus than usual.  There is a change in the color or thickness of your mucus.  Your breathing is more labored than usual.  Your breathing is faster than usual. SEEK IMMEDIATE MEDICAL CARE IF:  You have shortness of breath while you are resting.  You have shortness of breath that prevents you from:  Being able to talk.  Performing your usual physical activities.  You have chest pain lasting longer than 5 minutes.  Your skin color is more cyanotic than usual.  You measure low oxygen saturations for longer than 5 minutes with a pulse oximeter. MAKE SURE YOU:  Understand these instructions.  Will watch your condition.  Will get help right away if you are not doing well or get worse.   This information is not intended to replace advice given to you by your health care provider. Make sure you discuss any questions you have with your health care provider.   Document Released: 04/17/2005 Document Revised: 07/29/2014 Document Reviewed:  03/04/2013 Elsevier Interactive Patient Education [employer]

## 2015-09-22 NOTE — Progress Notes (Signed)
Patient ID: Sabrina Hayes, female   DOB: 09/29/1965, 50 y.o.   MRN: [id number]    Sabrina Kochmanda Leggio , 11/23/1965, 50 y.o., female MRN: [id number]  CC: cough Subjective: Pt presents for an acute OV with complaints of  of cough of one-week duration. Associated symptoms include nasal congestion, rhinorrhea, headache, facial pressure. Patient endorses low-grade fever. She denies any chills, or wheezing. She doesn't admit to increased shortness of breath. Patient has been a smoker since the age of 50. She is not on any inhalers or have a history of COPD. She does admit to increased phlegm production, chronic cough and increased in dyspnea even when not ill. She was treated with 7 weeks ago with similar symptoms with doxycycline and prednisone. She reports complete resolution with last treatment. She states she was doing fine until her significant other came home and was sick 10 days ago.   Allergies  Allergen Reactions  . Codeine   . Hydromet [Hydrocodone-Homatropine] Other (See Comments)    insomnia  . Penicillins    Social History  Substance Use Topics  . Smoking status: Current Every Day Smoker -- 0.50 packs/day for 34 years    Types: Cigarettes  . Smokeless tobacco: Never Used  . Alcohol Use: No   Past Medical History  Diagnosis Date  . Tobacco dependence   . Endometriosis    Past Surgical History  Procedure Laterality Date  . Abdominal hysterectomy      for NONMALIGNANT reasons (endometriosis).  Ovaries are still in.  . Tonsillectomy and adenoidectomy    . Tubal ligation     Family History  Problem Relation Age of Onset  . Hypertension Mother      Medication List    Notice  As of 09/22/2015  3:01 PM   You have not been prescribed any medications.      ROS: Negative, with the exception of above mentioned in HPI  Objective:  BP 113/71 mmHg  Pulse 80  Temp(Src) 98.1 F (36.7 C)  Resp 20  Wt 211 lb 4 oz (95.822 kg)  SpO2 97% Body mass index is 33.08 kg/(m^2). Gen: Afebrile.  No acute distress. Nontoxic in appearance, well-developed, well-nourished, obese Caucasian female. Pleasant. HENT: AT. [place]. Bilateral TM visualized and normal in appearance. MMM, no oral lesions. Bilateral nares moderate erythema, no swelling.. Throat without erythema or exudates. Cough present on exam. Hoarseness present on exam. tenderness to palpation facial sinuses, greater than right. Eyes:Pupils Equal Round Reactive to light, Extraocular movements intact,  Conjunctiva without redness, discharge or icterus. Neck/lymp/endocrine: Supple, no lymphadenopathy CV: RRR  Chest: CTAB, no wheeze or crackles. Good air movement, normal resp effort.  Abd: Soft. NTND. BS present.   Assessment/Plan: Sabrina Kochmanda Huard is a 50 y.o. female present for acute OV for 1. Acute maxillary sinusitis, recurrence not specified - Mucinex, cough syrup, advil/tylenol, rest, hydrate - doxycycline (VIBRA-TABS) 100 MG tablet; Take 1 tablet (100 mg total) by mouth 2 (two) times daily.  Dispense: 20 tablet; Refill: 0 - Ambulatory referral to Pulmonology - pt again encouraged to stop smoking.  - Pulm referral for pft/pos COPD with frequent bronchitis, smoker, dyspnea.   electronically signed by:  Felix Pacinienee Kuneff, DO  [facility] - [place]

## 2015-10-11 ENCOUNTER — Encounter: Payer: Self-pay | Admitting: Family Medicine

## 2015-10-20 ENCOUNTER — Telehealth: Payer: Self-pay

## 2015-10-20 ENCOUNTER — Telehealth: Payer: Self-pay | Admitting: *Deleted

## 2015-10-20 NOTE — Telephone Encounter (Signed)
Three attempts have been made to contact pt in regards to flu shot (08/21/15, 08/28/15, 10/20/15). Pt has not returned any calls. Flu shot has been marked as declined.

## 2015-10-20 NOTE — Telephone Encounter (Signed)
Called patient to inquire about flu vaccine status. Left vmail.

## 2015-10-20 NOTE — Telephone Encounter (Signed)
Patient called back and declined

## 2015-12-14 ENCOUNTER — Institutional Professional Consult (permissible substitution): Payer: Self-pay | Admitting: Pulmonary Disease

## 2016-09-01 IMAGING — CR DG SHOULDER 2+V*R*
3 series · 3 of 3 positions shown · non-contrast
Comparison: None.

CLINICAL DATA: Status right shoulder over exertion 2 days ago with
persistent pain

EXAM:
RIGHT SHOULDER - 2+ VIEW

[shoulder grashey]
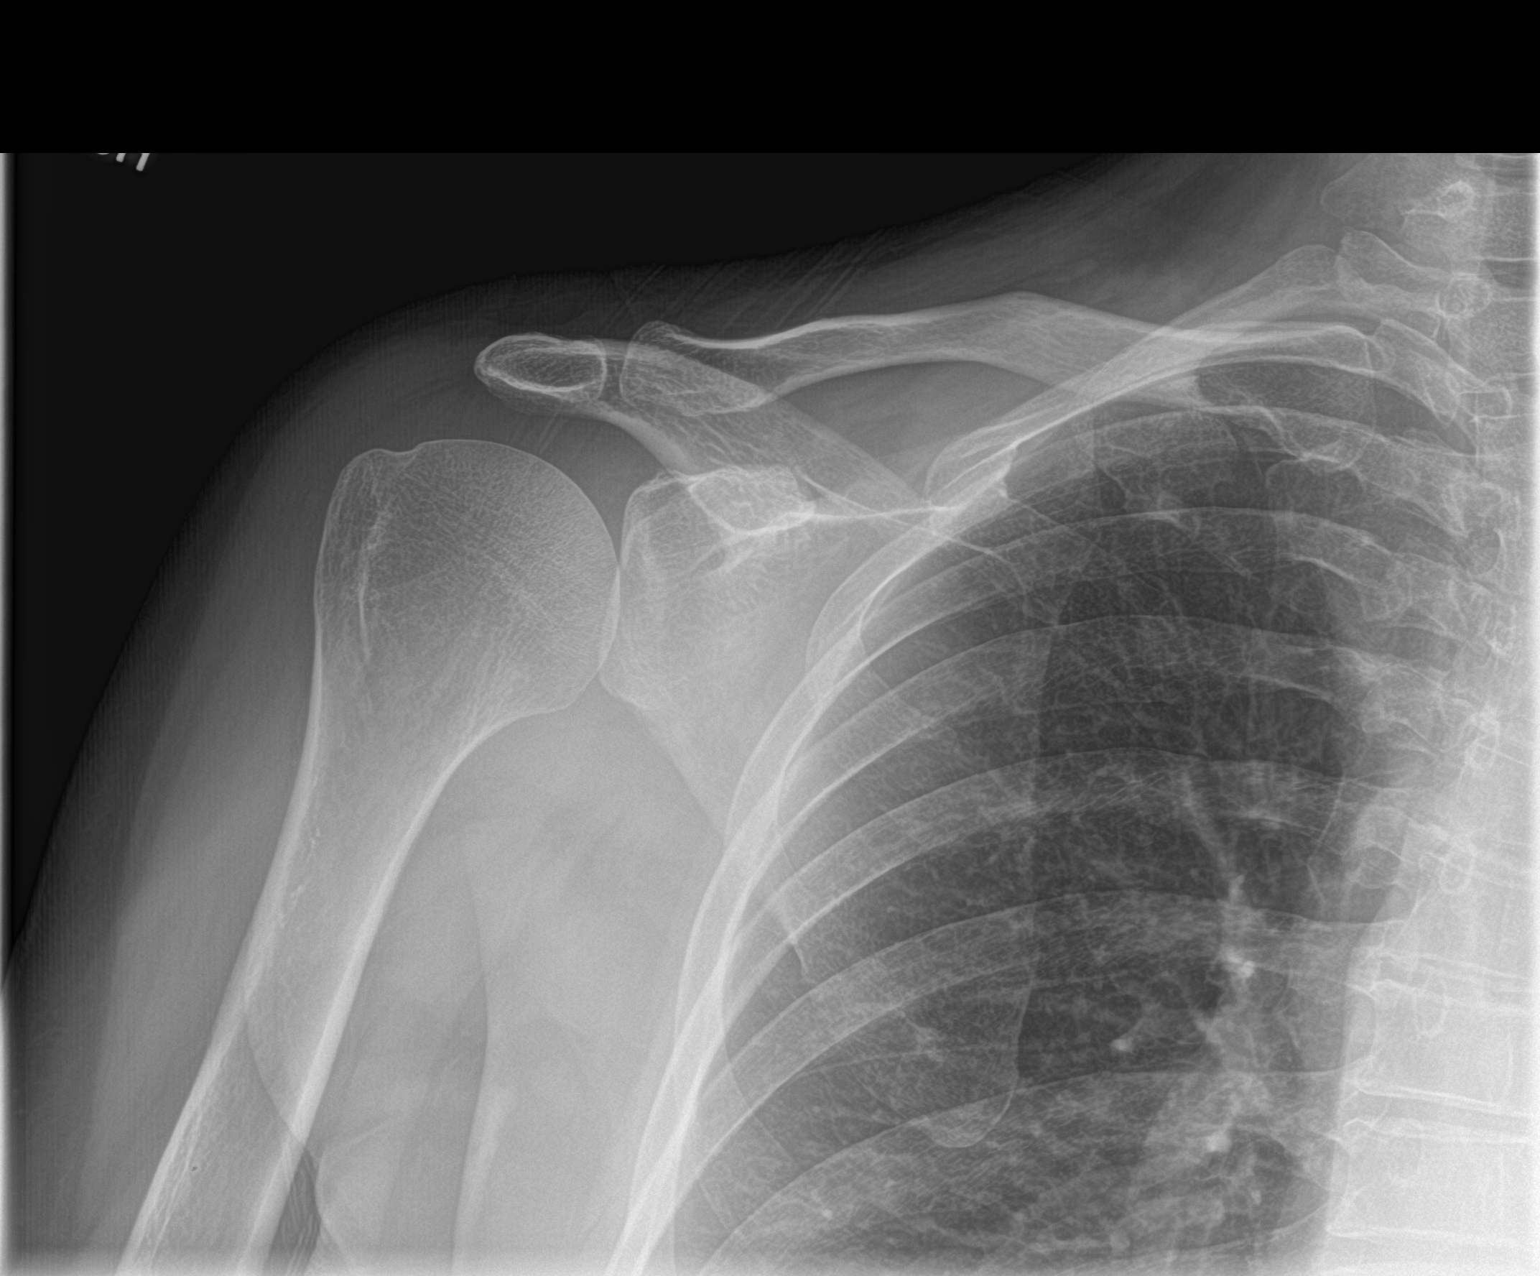

[shoulder y view]
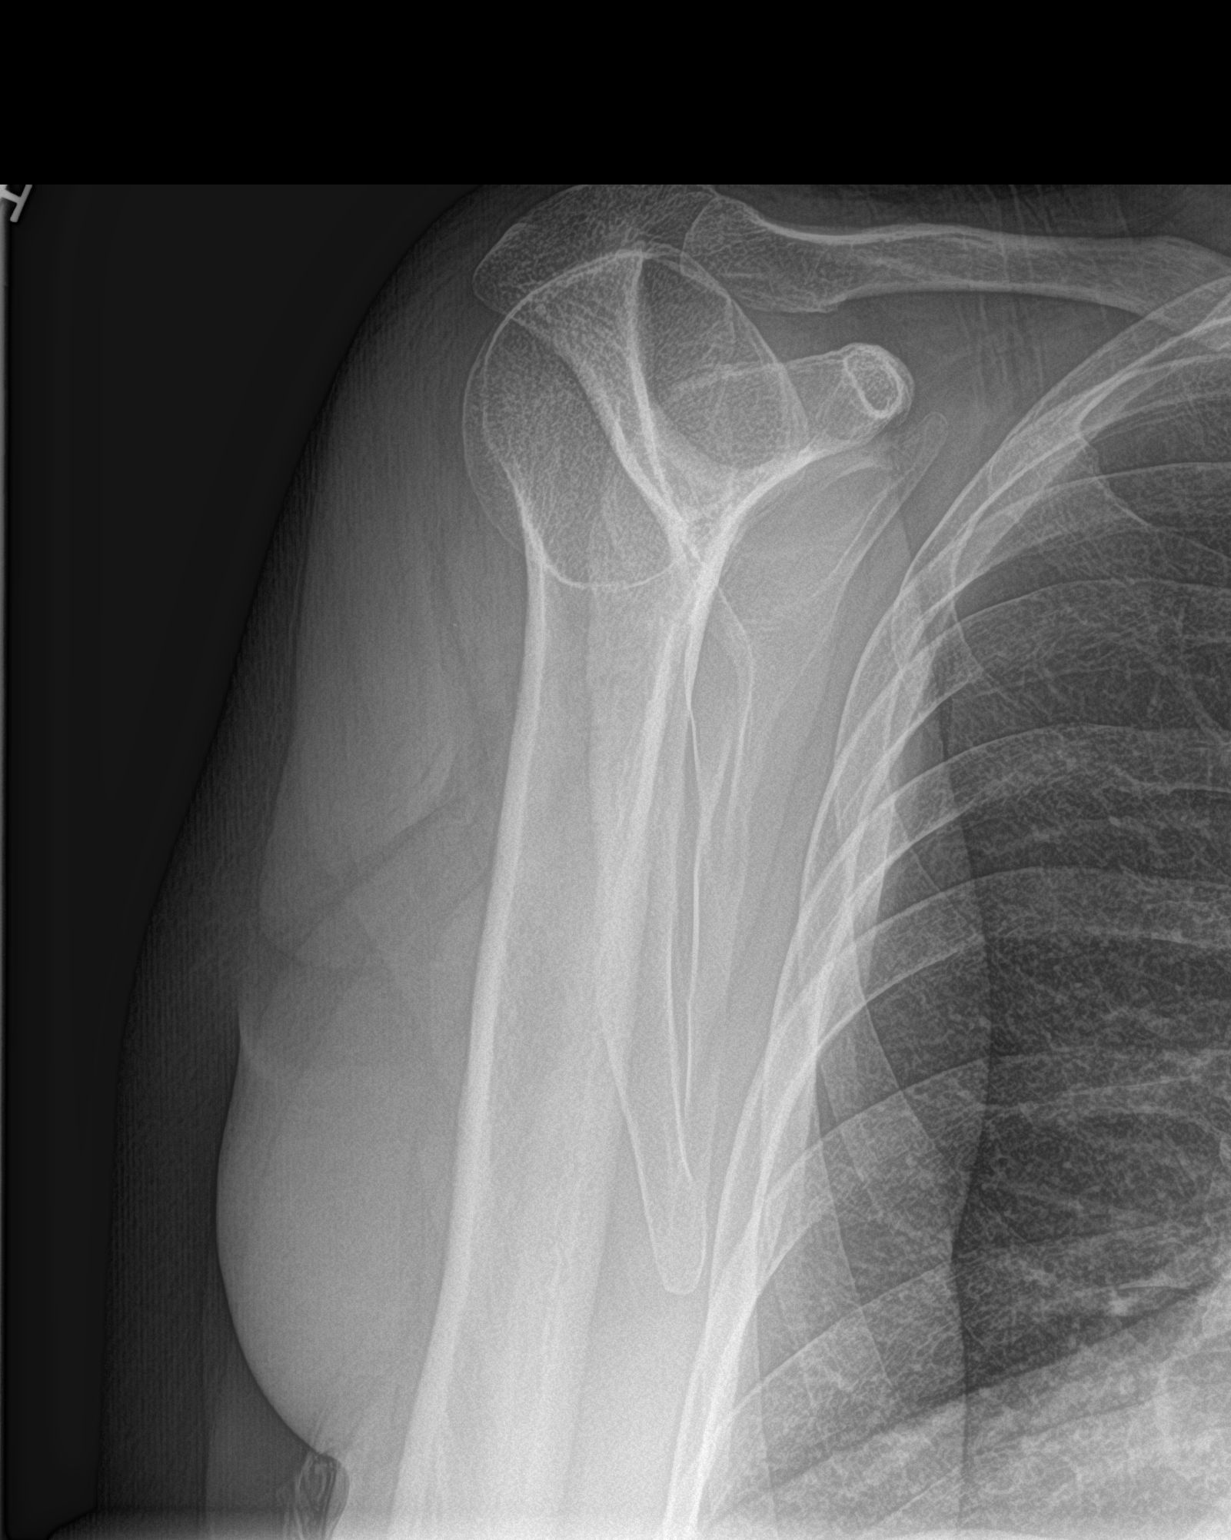

[shoulder axillary]
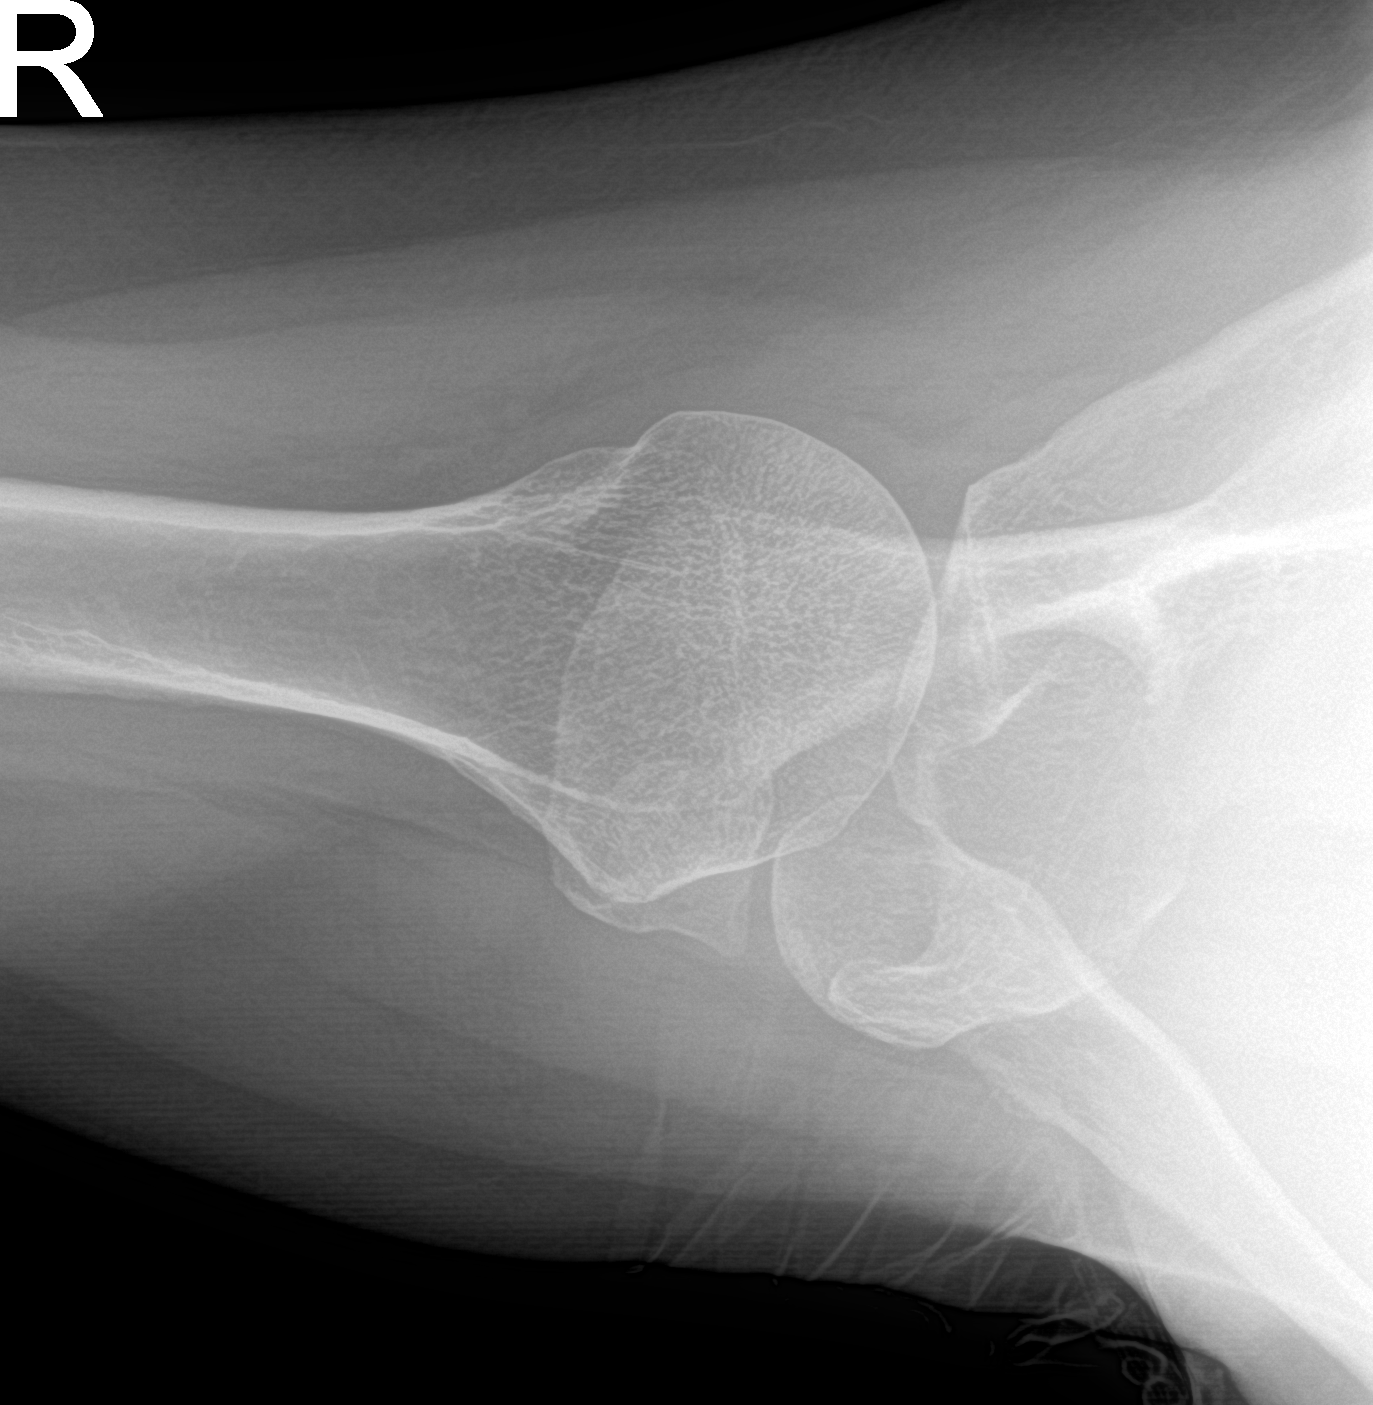

[3 of 3 positions shown; findings below may reference images not displayed]

FINDINGS: The bones of the right shoulder are adequately mineralized. There is
no acute fracture nor dislocation. The glenohumeral and AC joints
are unremarkable. The observed portions of the right clavicle and
upper right ribs are normal. The soft tissues are unremarkable.
IMPRESSION: There is no acute or chronic bony abnormality of the right shoulder.

## 2017-06-21 IMAGING — DX DG CHEST 2V
2 series · 2 of 2 positions shown · non-contrast
Comparison: February 20, 2013

CLINICAL DATA: Fever and wheezing for 3 days; cough and congestion

EXAM:
CHEST  2 VIEW

[chest pa]
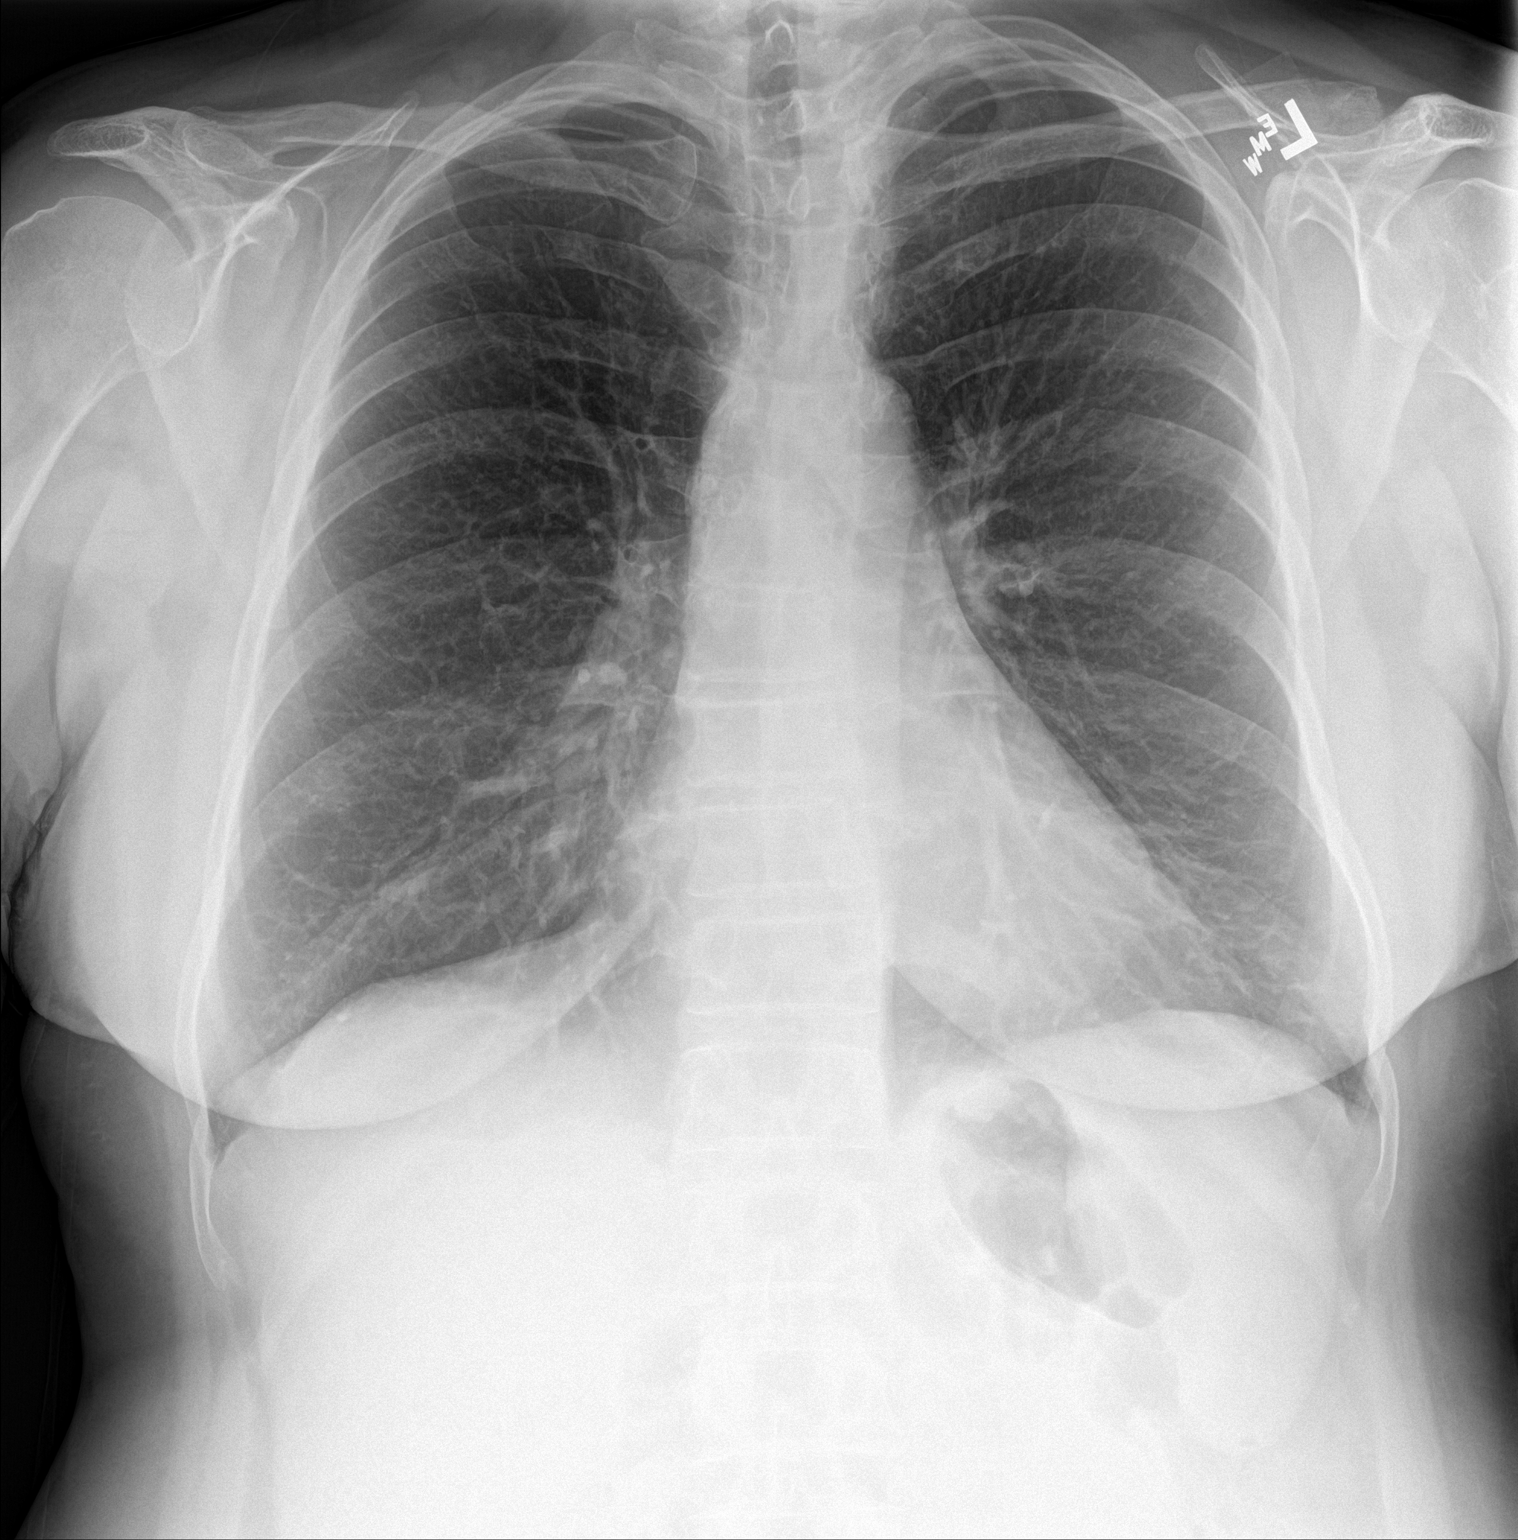

[chest lat]
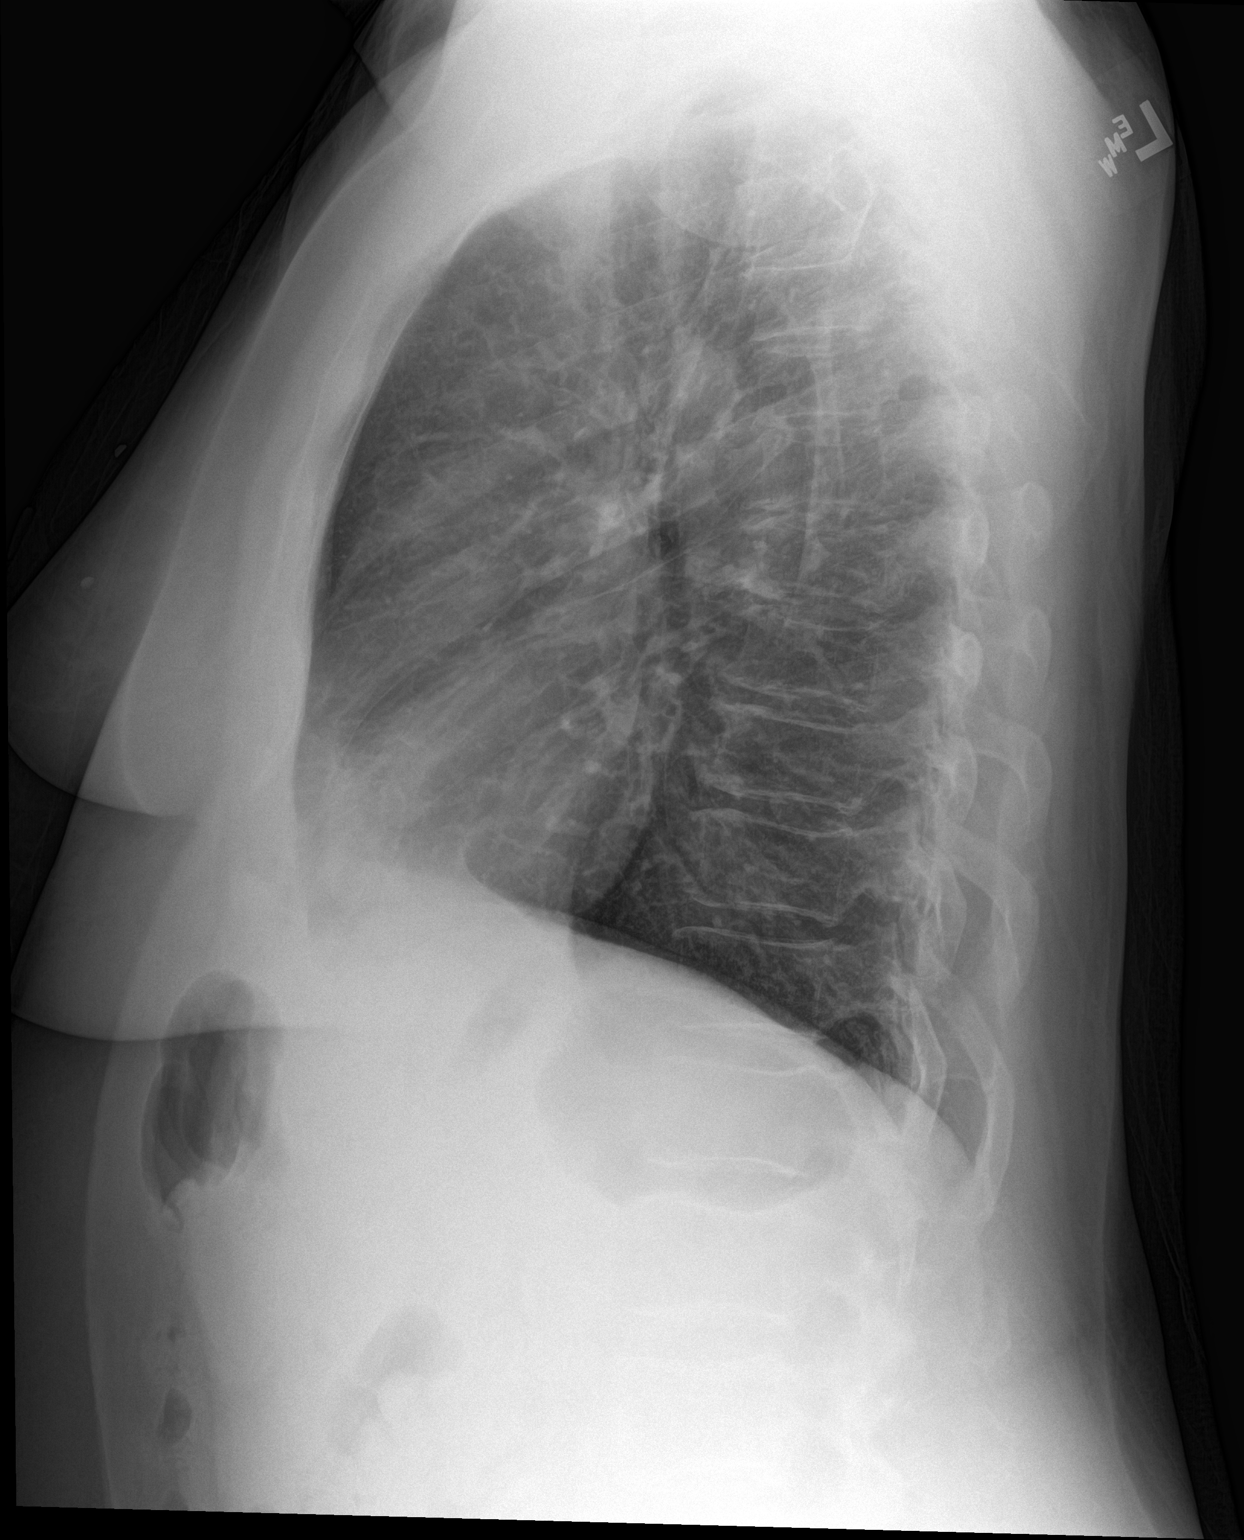

[2 of 2 positions shown; findings below may reference images not displayed]

FINDINGS: There is no appreciable edema or consolidation. Heart size and
pulmonary vascularity are normal. No adenopathy. There is
degenerative change in the thoracic spine. There is evidence of old
trauma involving the right clavicle.
IMPRESSION: No edema or consolidation.

## 2020-08-22 HISTORY — PX: OTHER SURGICAL HISTORY: SHX169

## 2020-09-19 DIAGNOSIS — U071 COVID-19: Secondary | ICD-10-CM

## 2020-09-19 HISTORY — DX: COVID-19: U07.1

## 2020-10-05 ENCOUNTER — Telehealth: Payer: Self-pay

## 2020-10-05 NOTE — Telephone Encounter (Signed)
Patient calling to set up appt with Dr. Milinda Cave.   Patient has not been seen by provider since CPE 09/18/2012, Acute OV 02/18/14; She states she has been seeing PCP is at [facility] until 07/21/2020.   Provider not accepting new patients at this time per Harmon Pier.  I left message on patient's cell phone to call our office regarding her request for new patient appt with Dr. Milinda Cave.

## 2021-05-06 ENCOUNTER — Encounter: Payer: Self-pay | Admitting: Emergency Medicine

## 2021-05-06 ENCOUNTER — Emergency Department
Admission: EM | Admit: 2021-05-06 | Discharge: 2021-05-06 | Disposition: A | Discharge status: Home / Self Care | Payer: 59 | Source: Home / Self Care | Attending: Family Medicine | Admitting: Family Medicine

## 2021-05-06 DIAGNOSIS — L309 Dermatitis, unspecified: Secondary | ICD-10-CM

## 2021-05-06 MED ORDER — FLUOCINONIDE 0.05 % EX OINT: 1.0000 "application " | TOPICAL_OINTMENT | Freq: Two times a day (BID) | CUTANEOUS | 0 refills | Status: AC

## 2021-05-06 MED ORDER — PREDNISONE 20 MG PO TABS: 20.0000 mg | ORAL_TABLET | Freq: Two times a day (BID) | ORAL | 0 refills | Status: AC

## 2021-05-06 NOTE — ED Provider Notes (Signed)
[facility]    CSN: [id number] Arrival date & time: 05/06/21  1104      History   Chief Complaint Chief Complaint  Patient presents with   Hand Pain    R thumb    HPI Sabrina Hayes is a 55 y.o. female.   HPI  Patient has a new job for about the last 6 months.  She has developed a rash on her hands that is chronic.  Most pronounced on her right thumb but also is present on her right index fingertip and left index fingertip.  No rash on the left thumb.  The rash is not improved with Neosporin or lotions.  She does wear gloves at work and does not think she has much chemical exposure.  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  She never had a rash previously. Patient states she sometimes has right knee pain.  Its bothering her today.  She is on meloxicam for chronic back pain.  I told her I expect this should help with her knee pain, but this is not an acute problem I recommend she take it up with her primary care doctor  Past Medical History:  Diagnosis Date   COVID-19 09/2020   vaccine x 2   Endometriosis    Tobacco dependence     Patient Active Problem List   Diagnosis Date Noted   Maxillary sinusitis 09/22/2015   Cough 08/03/2015   Inspiratory wheeze on examination 08/03/2015   Acute bronchitis 10/30/2013   Skin pruritus 05/21/2013   Health maintenance examination 09/18/2012   Tobacco dependence 08/25/2012    Past Surgical History:  Procedure Laterality Date   ABDOMINAL HYSTERECTOMY     for NONMALIGNANT reasons (endometriosis).  Ovaries are still in.   thumb Right 08/2020   trigger   TONSILLECTOMY AND ADENOIDECTOMY     TUBAL LIGATION      OB History   No obstetric history on file.      Home Medications    Prior to Admission medications   Medication Sig Start Date End Date Taking? Authorizing Provider  baclofen (LIORESAL) 10 MG tablet Take 1 tablet by mouth 3 (three) times daily. 11/15/20  Yes [provider]  fluocinonide ointment (LIDEX) 0.05 % Apply 1 application topically 2 (two) times daily. 05/06/21  Yes Eustace Moore, MD  gabapentin (NEURONTIN) 100 MG capsule Take by mouth. 03/01/21  Yes [provider]  predniSONE (DELTASONE) 20 MG tablet Take 1 tablet (20 mg total) by mouth 2 (two) times daily with a meal. 05/06/21  Yes Eustace Moore, MD  SUMAtriptan (IMITREX) 50 MG tablet Take 1 tab at onset of migraines, can repeat x 1 in 2 hours as needed. Max 2 doses/24 hours. 04/30/21  Yes [provider]  topiramate (TOPAMAX) 50 MG tablet Take 1 tablet by mouth at bedtime. 02/15/21  Yes [provider]  meloxicam (MOBIC) 15 MG tablet Take 15 mg by mouth daily. 03/28/21   [provider]    Family History Family History  Problem Relation Age of Onset   Hypertension Mother     Social History Social History   Tobacco Use   Smoking status: Former    Packs/day: 0.50    Years: 34.00    Pack years: 17.00    Types: Cigarettes    Quit date: 01/19/2021    Years since quitting: 0.2   Smokeless tobacco: Never  Vaping Use  Vaping Use: Never used  Substance Use Topics   Alcohol use: No   Drug use: No     Allergies   Codeine, Hydromet [hydrocodone bit-homatrop mbr], and Penicillins   Review of Systems Review of Systems See HPI  Physical Exam Triage Vital Signs ED Triage Vitals  Enc Vitals Group     BP 05/06/21 1150 125/76     Pulse Rate 05/06/21 1150 74     Resp 05/06/21 1150 18     Temp 05/06/21 1150 98.6 F (37 C)     Temp Source 05/06/21 1150 Oral     SpO2 05/06/21 1150 98 %     Weight 05/06/21 1152 240 lb (108.9 kg)     Height 05/06/21 1152 5\' 7"  (1.702 m)     Head Circumference --      Peak Flow --      Pain Score 05/06/21 1151 3     Pain Loc --      Pain Edu? --      Excl. in GC? --    No data found.  Updated Vital Signs BP 125/76 (BP Location: Left Arm)   Pulse 74   Temp 98.6 F (37 C) (Oral)   Resp 18    Ht 5\' 7"  (1.702 m)   Wt 108.9 kg   SpO2 98%   BMI 37.59 kg/m      Physical Exam Constitutional:      General: She is not in acute distress.    Appearance: She is well-developed.     Comments: Overweight  HENT:     Head: Normocephalic and atraumatic.     Mouth/Throat:     Comments: Mask in place Eyes:     Conjunctiva/sclera: Conjunctivae normal.     Pupils: Pupils are equal, round, and reactive to light.  Cardiovascular:     Rate and Rhythm: Normal rate.  Pulmonary:     Effort: Pulmonary effort is normal. No respiratory distress.  Abdominal:     General: There is no distension.     Palpations: Abdomen is soft.  Musculoskeletal:        General: Normal range of motion.     Cervical back: Normal range of motion.  Skin:    General: Skin is warm and dry.     Comments: The skin on the right thumb from the IP joint to the tip is erythematous, peeling, cracked, and inflamed.  No evidence of cellulitis or infection.  No drainage.  The tips of the index finger right and left both show fissuring with no other dermatitis  Neurological:     Mental Status: She is alert.  Psychiatric:        Mood and Affect: Mood normal.        Behavior: Behavior normal.     UC Treatments / Results  Labs (all labs ordered are listed, but only abnormal results are displayed) Labs Reviewed - No data to display  EKG   Radiology No results found.  Procedures Procedures (including critical care time)  Medications Ordered in UC Medications - No data to display  Initial Impression / Assessment and Plan / UC Course  I have reviewed the triage vital signs and the nursing notes.  Pertinent labs & imaging results that were available during my care of the patient were reviewed by me and considered in my medical decision making (see chart for details).     This may be work-related.  Difficult to tell on one examination.  She is  never had eczema prior to this job.It also could be an allergic  reaction to Neosporin.  She is putting Neosporin on under a Band-Aid and then wears gloves well in the workplace.  I told her to stop the Neosporin in case this is worsening the inflammation Final Clinical Impressions(s) / UC Diagnoses   Final diagnoses:  Eczema, unspecified type     Discharge Instructions      Do not use Neosporin Use the Lidex ointment on areas with skin irritation 2 times a day May use other hand moisturizers as needed throughout the day Take prednisone 2 times a day for 5 days Follow-up with your primary care doctor.  You may need a referral to dermatology   ED Prescriptions     Medication Sig Dispense Auth. Provider   predniSONE (DELTASONE) 20 MG tablet Take 1 tablet (20 mg total) by mouth 2 (two) times daily with a meal. 10 tablet Eustace Moore, MD   fluocinonide ointment (LIDEX) 0.05 % Apply 1 application topically 2 (two) times daily. 30 g Eustace Moore, MD      PDMP not reviewed this encounter.   Eustace Moore, MD 05/06/21 [phone]

## 2021-05-06 NOTE — ED Triage Notes (Signed)
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

## 2021-05-06 NOTE — Discharge Instructions (Addendum)
Do not use Neosporin Use the Lidex ointment on areas with skin irritation 2 times a day May use other hand moisturizers as needed throughout the day Take prednisone 2 times a day for 5 days Follow-up with your primary care doctor.  You may need a referral to dermatology

## 2021-05-30 ENCOUNTER — Emergency Department
Admission: EM | Admit: 2021-05-30 | Discharge: 2021-05-30 | Disposition: A | Discharge status: Home / Self Care | Payer: 59 | Source: Home / Self Care

## 2021-05-30 ENCOUNTER — Other Ambulatory Visit: Payer: Self-pay

## 2021-05-30 DIAGNOSIS — R071 Chest pain on breathing: Secondary | ICD-10-CM | POA: Diagnosis not present

## 2021-05-30 NOTE — ED Triage Notes (Signed)
Pt c/o RT rib pain x 4 days. Denies coughing or injury. Says she feels like its cracked. Takes baclofen and gabapentin for other unrelated problems which isnt helping. Aleve prn. Pain 7/10

## 2021-05-30 NOTE — Discharge Instructions (Addendum)
Advised patient to go to [facility] now for further evaluation and probable CT of chest with contrast to rule out pulmonary embolism.  Patient agreed and verbalized understanding of these instructions and this plan of care this evening.

## 2021-05-30 NOTE — ED Provider Notes (Signed)
[facility]    CSN: [id number] Arrival date & time: 05/30/21  1759      History   Chief Complaint Chief Complaint  Patient presents with   Chest Pain    RIB RT    HPI Sabrina Hayes is a 55 y.o. female.   HPI 55 year old female presents with right rib pain for 4 days.  Denies coughing or injury.  Says she feels like it is cracked.  PMH significant for cough, wheeze and tobacco dependence.  Patient reports chest pain upon breathing for the past 4 days.  Past Medical History:  Diagnosis Date   COVID-19 09/2020   vaccine x 2   Endometriosis    Tobacco dependence     Patient Active Problem List   Diagnosis Date Noted   Maxillary sinusitis 09/22/2015   Cough 08/03/2015   Inspiratory wheeze on examination 08/03/2015   Acute bronchitis 10/30/2013   Skin pruritus 05/21/2013   Health maintenance examination 09/18/2012   Tobacco dependence 08/25/2012    Past Surgical History:  Procedure Laterality Date   ABDOMINAL HYSTERECTOMY     for NONMALIGNANT reasons (endometriosis).  Ovaries are still in.   thumb Right 08/2020   trigger   TONSILLECTOMY AND ADENOIDECTOMY     TUBAL LIGATION      OB History   No obstetric history on file.      Home Medications    Prior to Admission medications   Medication Sig Start Date End Date Taking? Authorizing Provider  baclofen (LIORESAL) 10 MG tablet Take 1 tablet by mouth 3 (three) times daily. 11/15/20   [provider]  fluocinonide ointment (LIDEX) 0.05 % Apply 1 application topically 2 (two) times daily. 05/06/21   Eustace Moore, MD  gabapentin (NEURONTIN) 100 MG capsule Take by mouth. 03/01/21   [provider]  meloxicam (MOBIC) 15 MG tablet Take 15 mg by mouth daily. 03/28/21   [provider]  predniSONE (DELTASONE) 20 MG tablet Take 1 tablet (20 mg total) by mouth 2 (two) times daily with a meal. 05/06/21   Eustace Moore, MD  SUMAtriptan (IMITREX) 50 MG tablet Take 1 tab at onset  of migraines, can repeat x 1 in 2 hours as needed. Max 2 doses/24 hours. 04/30/21   [provider]  topiramate (TOPAMAX) 50 MG tablet Take 1 tablet by mouth at bedtime. 02/15/21   [provider]    Family History Family History  Problem Relation Age of Onset   Hypertension Mother     Social History Social History   Tobacco Use   Smoking status: Former    Packs/day: 0.50    Years: 34.00    Pack years: 17.00    Types: Cigarettes    Quit date: 01/19/2021    Years since quitting: 0.3   Smokeless tobacco: Never  Vaping Use   Vaping Use: Never used  Substance Use Topics   Alcohol use: No   Drug use: No     Allergies   Codeine, Hydromet [hydrocodone bit-homatrop mbr], and Penicillins   Review of Systems Review of Systems  Musculoskeletal:        Right-sided rib cage pain for 4 days    Physical Exam Triage Vital Signs ED Triage Vitals [05/30/21 1811]  Enc Vitals Group     BP (!) 148/92     Pulse Rate 91     Resp 18     Temp 98 F (36.7 C)     Temp Source Oral  SpO2 98 %     Weight      Height      Head Circumference      Peak Flow      Pain Score 7     Pain Loc      Pain Edu?      Excl. in GC?    No data found.  Updated Vital Signs BP (!) 148/92 (BP Location: Right Arm)   Pulse 91   Temp 98 F (36.7 C) (Oral)   Resp 18   SpO2 98%     Physical Exam Vitals and nursing note reviewed.  Constitutional:      Appearance: She is obese.  HENT:     Head: Normocephalic.     Right Ear: Tympanic membrane, ear canal and external ear normal.     Left Ear: Tympanic membrane, ear canal and external ear normal.     Nose: Nose normal.     Mouth/Throat:     Mouth: Mucous membranes are moist.     Pharynx: Oropharynx is clear.  Eyes:     Extraocular Movements: Extraocular movements intact.     Conjunctiva/sclera: Conjunctivae normal.     Pupils: Pupils are equal, round, and reactive to light.  Cardiovascular:     Rate and Rhythm: Normal  rate and regular rhythm.     Pulses: Normal pulses.     Heart sounds: Normal heart sounds.  Pulmonary:     Effort: Pulmonary effort is normal.     Breath sounds: Normal breath sounds.  Musculoskeletal:        General: Normal range of motion.     Cervical back: Normal range of motion and neck supple.  Skin:    General: Skin is warm and dry.  Neurological:     General: No focal deficit present.     Mental Status: She is alert and oriented to person, place, and time. Mental status is at baseline.     UC Treatments / Results  Labs (all labs ordered are listed, but only abnormal results are displayed) Labs Reviewed - No data to display  EKG   Radiology No results found.  Procedures Procedures (including critical care time)  Medications Ordered in UC Medications - No data to display  Initial Impression / Assessment and Plan / UC Course  I have reviewed the triage vital signs and the nursing notes.  Pertinent labs & imaging results that were available during my care of the patient were reviewed by me and considered in my medical decision making (see chart for details).     MDM: 1.  Chest pain with breathing-Advised patient to go to [facility] now for further evaluation and probable CT of chest with contrast to rule out pulmonary embolism.  Patient agreed and verbalized understanding of these instructions and this plan of care this evening.  Discharged hemodynamically stable. Final Clinical Impressions(s) / UC Diagnoses   Final diagnoses:  Chest pain varying with breathing     Discharge Instructions      Advised patient to go to [facility] now for further evaluation and probable CT of chest with contrast to rule out pulmonary embolism.  Patient agreed and verbalized understanding of these instructions and this plan of care this evening.     ED Prescriptions   None    PDMP not reviewed this encounter.   Trevor Iha, FNP 05/30/21 1931
